# Patient Record
Sex: Female | Born: 1985 | Race: White | Hispanic: No | Marital: Married | Smoking: Never smoker
Health system: Southern US, Community
[De-identification: ages and names within clinical notes are randomized; demographics above are authoritative.]

## PROBLEM LIST (undated history)

## (undated) ENCOUNTER — Inpatient Hospital Stay: Payer: Self-pay

## (undated) DIAGNOSIS — Z8711 Personal history of peptic ulcer disease: Secondary | ICD-10-CM

## (undated) DIAGNOSIS — G43909 Migraine, unspecified, not intractable, without status migrainosus: Secondary | ICD-10-CM

## (undated) DIAGNOSIS — T753XXA Motion sickness, initial encounter: Secondary | ICD-10-CM

## (undated) DIAGNOSIS — Z87898 Personal history of other specified conditions: Secondary | ICD-10-CM

## (undated) DIAGNOSIS — Z8742 Personal history of other diseases of the female genital tract: Secondary | ICD-10-CM

## (undated) DIAGNOSIS — I498 Other specified cardiac arrhythmias: Secondary | ICD-10-CM

## (undated) DIAGNOSIS — J45909 Unspecified asthma, uncomplicated: Secondary | ICD-10-CM

## (undated) DIAGNOSIS — Z86711 Personal history of pulmonary embolism: Secondary | ICD-10-CM

## (undated) DIAGNOSIS — Z8719 Personal history of other diseases of the digestive system: Secondary | ICD-10-CM

## (undated) DIAGNOSIS — Z973 Presence of spectacles and contact lenses: Secondary | ICD-10-CM

## (undated) DIAGNOSIS — F329 Major depressive disorder, single episode, unspecified: Secondary | ICD-10-CM

## (undated) DIAGNOSIS — G90A Postural orthostatic tachycardia syndrome (POTS): Secondary | ICD-10-CM

## (undated) DIAGNOSIS — T4145XA Adverse effect of unspecified anesthetic, initial encounter: Secondary | ICD-10-CM

## (undated) DIAGNOSIS — Z87442 Personal history of urinary calculi: Secondary | ICD-10-CM

## (undated) DIAGNOSIS — R760 Raised antibody titer: Secondary | ICD-10-CM

## (undated) DIAGNOSIS — E669 Obesity, unspecified: Secondary | ICD-10-CM

## (undated) DIAGNOSIS — F32A Depression, unspecified: Secondary | ICD-10-CM

## (undated) DIAGNOSIS — R519 Headache, unspecified: Secondary | ICD-10-CM

## (undated) DIAGNOSIS — R51 Headache: Secondary | ICD-10-CM

## (undated) DIAGNOSIS — Z87440 Personal history of urinary (tract) infections: Secondary | ICD-10-CM

## (undated) DIAGNOSIS — J302 Other seasonal allergic rhinitis: Secondary | ICD-10-CM

## (undated) DIAGNOSIS — T8859XA Other complications of anesthesia, initial encounter: Secondary | ICD-10-CM

## (undated) HISTORY — DX: Obesity, unspecified: E66.9

## (undated) HISTORY — DX: Depression, unspecified: F32.A

## (undated) HISTORY — DX: Headache: R51

## (undated) HISTORY — DX: Raised antibody titer: R76.0

## (undated) HISTORY — PX: TUBAL LIGATION: SHX77

## (undated) HISTORY — DX: Headache, unspecified: R51.9

## (undated) HISTORY — DX: Personal history of other diseases of the female genital tract: Z87.42

## (undated) HISTORY — DX: Personal history of urinary (tract) infections: Z87.440

## (undated) HISTORY — DX: Other seasonal allergic rhinitis: J30.2

## (undated) HISTORY — DX: Major depressive disorder, single episode, unspecified: F32.9

## (undated) HISTORY — DX: Personal history of pulmonary embolism: Z86.711

## (undated) HISTORY — DX: Migraine, unspecified, not intractable, without status migrainosus: G43.909

## (undated) HISTORY — DX: Personal history of other specified conditions: Z87.898

---
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
## 2006-04-02 ENCOUNTER — Ambulatory Visit: Payer: Self-pay

## 2006-07-27 ENCOUNTER — Emergency Department: Payer: Self-pay | Admitting: Emergency Medicine

## 2006-07-27 ENCOUNTER — Other Ambulatory Visit: Payer: Self-pay

## 2006-07-29 ENCOUNTER — Ambulatory Visit: Payer: Self-pay | Admitting: Internal Medicine

## 2006-08-04 ENCOUNTER — Ambulatory Visit: Payer: Self-pay | Admitting: Neurology

## 2006-08-14 ENCOUNTER — Ambulatory Visit: Payer: Self-pay | Admitting: Neurology

## 2006-08-20 ENCOUNTER — Ambulatory Visit: Payer: Self-pay | Admitting: Internal Medicine

## 2006-08-26 ENCOUNTER — Ambulatory Visit: Payer: Self-pay | Admitting: Internal Medicine

## 2006-08-26 ENCOUNTER — Ambulatory Visit (HOSPITAL_COMMUNITY): Admission: RE | Admit: 2006-08-26 | Discharge: 2006-08-26 | Payer: Self-pay | Admitting: Internal Medicine

## 2007-02-15 ENCOUNTER — Other Ambulatory Visit: Payer: Self-pay

## 2007-02-15 ENCOUNTER — Emergency Department: Payer: Self-pay | Admitting: Emergency Medicine

## 2007-02-24 ENCOUNTER — Ambulatory Visit: Payer: Self-pay | Admitting: Internal Medicine

## 2007-05-12 ENCOUNTER — Emergency Department: Payer: Self-pay | Admitting: Emergency Medicine

## 2007-05-16 ENCOUNTER — Emergency Department: Payer: Self-pay | Admitting: Emergency Medicine

## 2007-06-01 ENCOUNTER — Ambulatory Visit: Payer: Self-pay | Admitting: Internal Medicine

## 2008-02-01 ENCOUNTER — Ambulatory Visit: Payer: Self-pay | Admitting: Internal Medicine

## 2008-02-01 ENCOUNTER — Inpatient Hospital Stay: Payer: Self-pay | Admitting: Internal Medicine

## 2008-02-28 ENCOUNTER — Emergency Department: Payer: Self-pay | Admitting: Emergency Medicine

## 2008-02-28 ENCOUNTER — Other Ambulatory Visit: Payer: Self-pay

## 2008-03-27 ENCOUNTER — Emergency Department: Payer: Self-pay | Admitting: Emergency Medicine

## 2008-03-27 ENCOUNTER — Other Ambulatory Visit: Payer: Self-pay

## 2008-04-04 ENCOUNTER — Ambulatory Visit: Payer: Self-pay | Admitting: Physician Assistant

## 2008-09-01 DIAGNOSIS — Z86711 Personal history of pulmonary embolism: Secondary | ICD-10-CM

## 2008-09-01 HISTORY — DX: Personal history of pulmonary embolism: Z86.711

## 2008-11-04 ENCOUNTER — Emergency Department: Payer: Self-pay | Admitting: Internal Medicine

## 2008-11-06 ENCOUNTER — Inpatient Hospital Stay: Payer: Self-pay | Admitting: Internal Medicine

## 2009-02-12 ENCOUNTER — Ambulatory Visit: Payer: Self-pay | Admitting: Neurology

## 2009-09-06 ENCOUNTER — Emergency Department: Payer: Self-pay | Admitting: Unknown Physician Specialty

## 2011-01-14 NOTE — Letter (Signed)
February 24, 2007    Bethann Punches, M.D.  [address]  [place], [place] [address]   RE:  Debra Terry, Debra Terry  MRN:  [id number]  /  DOB:  28-Sep-1985   Dear Loraine Leriche,   This letter will probably __________  voice mail to you, but thank you  very much for the opportunity to see Nimsi Males because of recurrent  and refractory symptoms of lightheadedness.   I had seen her, as you remember, for Dr. Grandville Silos in the fall because  of recurrent syncope.  Tilt table testing had been consistent with a  cardioinhibitory vasodepressor neurally mediated mechanism.  She was  started initially on Effexor for that.  About a month later, because  there was no significant improvement, she was started on Toprol.  She  then had a great four months.  At some point, however, her blood  pressure was noted to be elevated.  Her diastolic she thinks was over  191 on salt and water repletion, and her salt repletion was back down,  and in the wake of that she became increasingly symptomatic.  This is  characterized by both recurrent syncope as well as significant  unpredictable and irregular fatigue that can last for a day or so.   She has significant heat intolerance, significant shower intolerance.  I  forgot to ask her today about the status of her periods.   She is actually working right now and is able to stand most days at  work.  She sometimes needs to work on a stool, but has tolerated this  really quite well.   She has also recently developed chest pain.  This has been over the last  couple of weeks.  These episodes typically last about 5 minutes,  occurring mostly in the middle of the day.  They are associated with a  brackish taste.  They are not related to exertion.  She has noted no  change in the color or the odor of her stool.   Her diet is not particularly caffeine loaded.   Her medications currently include Effexor 75 and metoprolol 75, and  these have been up and down titrated, as you  know better than I.  She is  currently no longer on Keppra.  She has no known drug allergies.   SOCIAL HISTORY:  She is single.  As you know, she is out of high school  and she is working as a Associate Professor.   PHYSICAL EXAMINATION:  VITAL SIGNS:  Weight was 254 pounds, which is  stable.  Her blood pressure was 97/60, with a pulse of 76, sitting at 0  minutes it 96/60 with a pulse of 84, standing at 0 minutes it was 96/74  with a pulse of 86, and at 2 minutes it was 92/72 with a pulse of 86.  There was some accompanying dizziness.  HEENT:  Demonstrated no icterus or xanthomata.  NECK:  Veins were flat.  Carotids were briskly full bilaterally, without  bruits.  BACK:  Without kyphosis or scoliosis.  LUNGS:  Clear.  HEART SOUNDS:  Regular, without murmurs or gallops.  ABDOMEN:  Soft, with active bowel sounds.  EXTREMITIES:  Without edema.  There was no clubbing or cyanosis.  NEUROLOGIC:  Grossly normal.   IMPRESSION:  1. Neurally mediated syncope.  2. Apparent problems with hypertension.  3. Atypical chest pain, probably gastrointestinal in origin.  4. Obesity.   Loraine Leriche, I think that Ms. Wallace's story and her findings are  still most  consistent with neurally mediated episodes.  She and her mom have  educated themselves on the [employer] and the [facility] website and find  her symptoms quite consistent with the stories outlined there.   The fact that she got much better with her blood pressure improvement  also supports our working hypothesis, and the worsening of her symptoms  with a fall in blood pressure related to salt depletion is certainly  further support.   I think the issue is going to be trying to balance salt repletion with  blood pressure issues, and so I have encouraged her to go back on the  Gatorade.  If this is inadequate, low-dose Florinef may be beneficial in  the 60-100 mcg dose, needing to pay careful attention to her blood  pressure.   As relates to her  chest pain, I suspect that this is GI in origin,  accompanied by the brackish taste, and I did not start but will defer to  you whether you think a PPI or an over-the-counter H2 blocker might be  appropriate.   I look forward to talking with you about her to find out how I can best  be available to help in her long-term management.  If I can be of any  further assistance in the short term, please do not hesitate to let me  know.    Sincerely,      Duke Salvia, MD, [facility]  Electronically Signed    SCK/MedQ  DD: 02/24/2007  DT: 02/24/2007  Job #: [id number]

## 2011-01-17 NOTE — Letter (Signed)
August 20, 2006    Debra Terry, M.D.  [facility] Dept. of Neurology  [address]  [place], [place] [address]   RE:  Debra, Terry  MRN:  [id number]  /  DOB:  Feb 18, 1986   Dear Dr. Chestine Spore:   I hope that I have had a chance to meet you in person prior to your  receiving this letter.   It was a privilege to see Debra Terry today at your request because  of recurrent syncope.   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  These have included both syncopal spells as well as presyncopal  spells which in their immediate prodrome, albeit very brief in the  recovery phase, seem to the patient to be identical.  She has undergone  extensive neurological evaluation and has ended up on Keppra.  She  describes the spells as follows.  Most recently she had a spell  associated with full loss of consciousness.  She was standing at the  stove.  She became lightheaded but only for a moment before she  collapsed to the ground.  She was described by her friend as being pale.  There was no residual diaphoresis or nausea.  She was quite groggy.  She  tried to stand up afterwards.  She became quite nauseated and overall  took about an hour or two for her to recover.  This is relatively  typical for her standing spells associated with loss of consciousness.  There has been no loss of consciousness while seated.   Occasionally the prodrome has been a little bit longer, and occasionally  the recovery phase has been a little bit less associated with retrograde  amnesia.   She has had presyncopal spells.  These have occurred, for example, at  work while she was sitting she would become lightheaded and then become  somewhat vertiginous.  She would get very hot, then this would be  followed by mild diaphoresis.  She would be described as extremely pale.  There were some occasional  palpitations.  In the recovery phase there  was significant residual orthostatic intolerance and the symptoms would  last for about 15 or 20 minutes.  There was significant fatigue  afterwards as well.   She has a baseline history of shower intolerance as well as orthostatic  intolerance.  Her diet includes salt only with cooking but there is no  added salt.   Her menses are heavy but she notes no specific relation.  Her urine  color is light yellow.  She drinks only one caffeine cup a day.   Notably, she describes work as stressful.  This is important as the  frequency of these episodes accelerated over the summer and then  accelerated extremely over the fall.   Her past surgical history is notable for a tonsillectomy, eye surgery,  and D&C in 2007.   Her medical review of systems is notable for allergies and asthma.  Otherwise, it is negative.   Her medications include Keppra as noted at 500 b.i.d.   She has no known drug allergies.   On examination, she is a young Caucasian female appearing her stated age  of 25.  Her blood pressure is 121/85 with a pulse of 76 lying.  Sitting  it was 127/82 with a pulse of 85.  Standing at 0 minutes it was 135/82  with a pulse of 90.  At 5 minutes it was 136/92 with a pulse of 88.   Incomplete.    Sincerely,      Duke Salvia, MD, [facility]  Electronically Signed    SCK/MedQ  DD: 08/20/2006  DT: 08/20/2006  Job #: [id number]

## 2011-01-17 NOTE — Op Note (Signed)
NAMEWILLY, Debra Terry NO.:  [id number]   MEDICAL RECORD NO.:  [id number]          PATIENT TYPE:  OIB   LOCATION:  2854                         FACILITY:  [facility]   PHYSICIAN:  Duke Salvia, MD, FACCDATE OF BIRTH:  [birth year]   DATE OF PROCEDURE:  08/26/2006  DATE OF DISCHARGE:                               OPERATIVE REPORT   PREOPERATIVE DIAGNOSIS:  Syncope.   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   PROCEDURE:  Head up, tilt table testing.   DESCRIPTION OF PROCEDURE:  Following obtaining the informed consent, the  patient was brought to the electrophysiology laboratory and placed on  the fluoroscopic table.  After equilibration in the supine position with  blood pressures in the 110-115 range and pulses in the high 70s low 80s;  the patient was tilted up to 70 degrees.  There was some lability in  heart rate with a rapid change in heart rate over 2 minutes to 102,  gradual equilibration and then a gradual progressive heart rate after 1  minute 15 seconds to a peak of 120. Throughout this duration the blood  pressures remained relatively stable as well.   At 1 minute 15 the pulse then change rapidly from 101 down to 71,  reaching a nadir of 68.  The blood pressure changed abruptly from 110/38  to a pulse was 60/44 with, then, some stabilization in the high 70s low  80s with a subsequent fall into the 60s; again, the pulse continued to  fall from mid-70s.  It peeked up, again, to 102 and then fell to 68 at  which time the test was terminated.  The patient was in tears, wanting  to stop; and it was elected to stop at that point.   IMPRESSION:  Abnormal tilt table test manifested by vaso depression and  cardioinhibition with epi phenomena consistent with her previous  episodes.   RECOMMENDATIONS:  Would treat with a presumptive diagnosis of nearly  mediated syncope with  salt-and-water replacement, isometric exercises,  and consideration for SSRI therapy, and/or beta blocker therapy, and/or  Florinef as symptoms dictate.      Duke Salvia, MD, [facility]  Electronically Signed     SCK/MEDQ  D:  08/26/2006  T:  08/26/2006  Job:  [id number]   cc:   Suzan Slick, M.D.  Electrophysiology Laboratory

## 2011-01-22 ENCOUNTER — Ambulatory Visit (INDEPENDENT_AMBULATORY_CARE_PROVIDER_SITE_OTHER): Payer: BC Managed Care – PPO | Admitting: Family Medicine

## 2011-01-22 ENCOUNTER — Encounter: Payer: Self-pay | Admitting: Family Medicine

## 2011-01-22 DIAGNOSIS — M2550 Pain in unspecified joint: Secondary | ICD-10-CM

## 2011-01-22 DIAGNOSIS — R894 Abnormal immunological findings in specimens from other organs, systems and tissues: Secondary | ICD-10-CM

## 2011-01-22 DIAGNOSIS — Z86711 Personal history of pulmonary embolism: Secondary | ICD-10-CM

## 2011-01-22 DIAGNOSIS — F329 Major depressive disorder, single episode, unspecified: Secondary | ICD-10-CM

## 2011-01-22 DIAGNOSIS — R760 Raised antibody titer: Secondary | ICD-10-CM | POA: Insufficient documentation

## 2011-01-22 DIAGNOSIS — F3289 Other specified depressive episodes: Secondary | ICD-10-CM

## 2011-01-22 DIAGNOSIS — G43909 Migraine, unspecified, not intractable, without status migrainosus: Secondary | ICD-10-CM

## 2011-01-22 MED ORDER — MELOXICAM 15 MG PO TABS: ORAL_TABLET | ORAL | Status: DC

## 2011-01-22 NOTE — Patient Instructions (Signed)
Return in next 1-2 months for follow up and physical Return fasting prior to physical for blood work. I will request records from previous PCP. Course of mobic once daily with food for 7 days to see if we can help with joint pains. Call us with questions.

## 2011-01-22 NOTE — Progress Notes (Signed)
Subjective:    Patient ID: Debra Terry, female    DOB: 09/25/1985, 25 y.o.   MRN: [id number]  HPI CC: new patient, establish  Previously saw Dr. Gavin Potters (x1) and before that Dr. Hyacinth Meeker at [facility].  Joint pain - started lower back about 2 years ago, now in knees, elbows.  Stiffness, pain.  Stiff in am, throughout day pain comes.  No redness/swelling in joints.  On her feet 11 hours a day.  No pain in feet or hands, shoulders, hip.  Takes alleve once daily which doesn't really help anymore.  No oral lesions, no new rashes.  No chest pain.  Muscle pain - mild to severe pain mainly back of calves as well as arm pain medial forearm.  Depression - started on wellbutrin last week, also on sertraline 100mg  daily.  No SI/HI.  Working on getting in to see counselor for coping with recent TAB late last year for pregnancy - daughter with turner's, heart condition.  Shaelyn says she regrets her decision, especially had trouble during mother's day.  Separated from husband after this stress, currently with boyfriend.  H/o neurocardiogenic syncope per patient, currently on metoprolol to help. 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  States able to tolerate NSAIDs. Was on lovenox during pregnancy last year.  Preventative: OBGYN - Dr. Patsy Lager, sees for well woman, last pap 12/2010 and normal. Tetanus 09/2010 - at Dr. Gavin Potters' Been over a year since last CPE, 1 1/2 years since saw hematologist.  Review of Systems  Constitutional: Positive for unexpected weight change (previously lost 100lbs, gained 20 lbs back.). Negative for fever, chills, activity change, appetite change and fatigue.  HENT: Negative for hearing loss and neck pain.   Eyes: Positive for photophobia. Negative for visual disturbance.  Respiratory: Negative for cough, choking, chest tightness, shortness of breath and wheezing.   Cardiovascular: Negative for chest pain,  palpitations and leg swelling.  Gastrointestinal: Positive for nausea. Negative for vomiting, abdominal pain, diarrhea, constipation, blood in stool and abdominal distention.  Genitourinary: Negative for hematuria and difficulty urinating.  Musculoskeletal: Positive for arthralgias. Negative for myalgias and joint swelling.  Skin: Negative for rash.  Neurological: Positive for headaches. Negative for dizziness, seizures and syncope.  Hematological: Bruises/bleeds easily.  Psychiatric/Behavioral: Positive for dysphoric mood. The patient is nervous/anxious.        Objective:   Physical Exam  Nursing note and vitals reviewed. Constitutional: She is oriented to person, place, and time. She appears well-developed and well-nourished. No distress.  HENT:  Head: Normocephalic and atraumatic.  Right Ear: External ear normal.  Left Ear: External ear normal.  Nose: Nose normal.  Mouth/Throat: Oropharynx is clear and moist.  Eyes: Conjunctivae and EOM are normal. Pupils are equal, round, and reactive to light. No scleral icterus.  Neck: Normal range of motion. Neck supple. No thyromegaly present.  Cardiovascular: Normal rate, regular rhythm, normal heart sounds and intact distal pulses.   No murmur heard. Pulses:      Radial pulses are 2+ on the right side, and 2+ on the left side.  Pulmonary/Chest: Effort normal and breath sounds normal. No respiratory distress. She has no wheezes. She has no rales.  Abdominal: Soft. Bowel sounds are normal. She exhibits no distension and no mass. There is no tenderness. There is no rebound and no guarding.  Musculoskeletal: Normal range of motion. She exhibits no edema and no tenderness.  FROM at elbows, knees No pain with palpation, no evident erythema or swelling of joints noted. No pain lateral/medial epicondyles, olecranon No pain anterior knee, patella, joint lines, no crepitus, no deformity. Bilaterally.  Lymphadenopathy:    She has no cervical  adenopathy.  Neurological: She is alert and oriented to person, place, and time.       CN grossly intact, station and gait intact  Skin: Skin is warm and dry. No rash noted.  Psychiatric: She has a normal mood and affect. Her behavior is normal. Judgment and thought content normal.       Assessment & Plan:

## 2011-01-23 DIAGNOSIS — M2550 Pain in unspecified joint: Secondary | ICD-10-CM | POA: Insufficient documentation

## 2011-01-23 NOTE — Assessment & Plan Note (Signed)
Elbows and knees.  No hand joint pains.  No inflammatory arthritis sxs. Unclear etiology.  No other sxs to suggest systemic condition. ? Worsening arthralgias 2/2 worsening depression/stress. Treat with course of NSAIDs for 1 wk (pt states has tolerated NSAIDs ok in past). If not better consider xrays and/or blood work.

## 2011-01-23 NOTE — Assessment & Plan Note (Signed)
Spent considerable amount of time discussing worsening mood issues, anticipate adjustment disorder surrounding coming to terms with recent TAB leading to depression. Discussed klonopin may not be best option if truly depression.  States using only prn basis. For now no med changes, recently started by OBGYN on wellbutrin, states wants adjunctive med prior to increasing sertraline.

## 2011-01-23 NOTE — Assessment & Plan Note (Addendum)
Stable.       - Continue to monitor

## 2011-01-23 NOTE — Assessment & Plan Note (Signed)
States previously well controlled, currently worsening again.  Has never been on ppx medication.  Using fioricet abortive.

## 2011-01-28 ENCOUNTER — Emergency Department: Payer: Self-pay | Admitting: Emergency Medicine

## 2011-02-06 ENCOUNTER — Encounter: Payer: Self-pay | Admitting: Family Medicine

## 2011-02-06 ENCOUNTER — Ambulatory Visit (INDEPENDENT_AMBULATORY_CARE_PROVIDER_SITE_OTHER): Payer: BC Managed Care – PPO | Admitting: Family Medicine

## 2011-02-06 VITALS — BP 120/84 | HR 66 | Temp 98.6°F | Wt 230.2 lb

## 2011-02-06 DIAGNOSIS — N390 Urinary tract infection, site not specified: Secondary | ICD-10-CM

## 2011-02-06 DIAGNOSIS — R1110 Vomiting, unspecified: Secondary | ICD-10-CM | POA: Insufficient documentation

## 2011-02-06 LAB — POCT URINALYSIS DIPSTICK
Blood, UA: NEGATIVE
Ketones, UA: NEGATIVE
Protein, UA: NEGATIVE
Spec Grav, UA: 1.025
Urobilinogen, UA: 0.2

## 2011-02-06 MED ORDER — PROMETHAZINE HCL 25 MG PO TABS: 25.0000 mg | ORAL_TABLET | Freq: Three times a day (TID) | ORAL | Status: DC | PRN

## 2011-02-06 NOTE — Progress Notes (Signed)
Went to gyn clinic, dx'd with UTI.  Had vomited some blood, so was sent to ER.  Likely that she had been vomiting from pain with the UTI and this likely caused irritation in GI tract.  Was on cipro for the uti, today was the last day of the abx.  She took prilosec and phenergan for NAV.  Still with some nausea,but controlled with meds.  Not vomiting.  No fevers.  H/o kidney infection in childhood.  Now with continued R back pain.  No dysuria now.  No discharge, no abnormal bleeding.  No abd pain.  No nosebleeds, bruising, hemoptysis.  Was on ASA for lupus anticoagulant.  Intolerant of coumadin.  Meds, vitals, and allergies reviewed.   ROS: See HPI.  Otherwise, noncontributory.  ncat Tm wnl, nasal and oral exam wnl Neck supple w/o LA rrr ctab abd soft, not ttp in suprapubic area Back w/o midline pain R lower lumbar area tender, reproduced with stretching.  No cva pain

## 2011-02-06 NOTE — Patient Instructions (Signed)
I would keep taking the phenergan as needed.  Keep taking the prilosec for another 2 weeks.  I would stretch the muscles in your back.  Let us know if you have a fever or other concerns.  Take care.

## 2011-02-07 ENCOUNTER — Encounter: Payer: Self-pay | Admitting: Family Medicine

## 2011-02-07 ENCOUNTER — Telehealth: Payer: Self-pay | Admitting: *Deleted

## 2011-02-07 ENCOUNTER — Ambulatory Visit (INDEPENDENT_AMBULATORY_CARE_PROVIDER_SITE_OTHER): Payer: BC Managed Care – PPO | Admitting: Family Medicine

## 2011-02-07 DIAGNOSIS — R509 Fever, unspecified: Secondary | ICD-10-CM

## 2011-02-07 DIAGNOSIS — M549 Dorsalgia, unspecified: Secondary | ICD-10-CM | POA: Insufficient documentation

## 2011-02-07 DIAGNOSIS — R109 Unspecified abdominal pain: Secondary | ICD-10-CM

## 2011-02-07 LAB — POCT URINALYSIS DIPSTICK
Blood, UA: NEGATIVE
Glucose, UA: NEGATIVE
Nitrite, UA: NEGATIVE
Urobilinogen, UA: 0.2

## 2011-02-07 MED ORDER — CIPROFLOXACIN HCL 500 MG PO TABS: 500.0000 mg | ORAL_TABLET | Freq: Two times a day (BID) | ORAL | Status: AC

## 2011-02-07 NOTE — Telephone Encounter (Signed)
Spoke with patient. She will be here at 4:15.

## 2011-02-07 NOTE — Assessment & Plan Note (Signed)
She was treated with cipro. Her U/a today is unremarkable and I doubt that she has any ongoing infection.  Likely with residual pain in back due to muscle strain from vomiting.  I don't suspect pyelo.  She is well appearing.  I would use phenergan prn, stretch for her back pain, and fu prn.  She agrees.

## 2011-02-07 NOTE — Telephone Encounter (Signed)
Add her on at the end of the day-4:15.

## 2011-02-07 NOTE — Patient Instructions (Signed)
Start the cipro today and use the phenergan as needed.  If you have a significant increase in pain or a decrease in urine output, then you need to go to the ER.  Take care.  Call me with an update next week.

## 2011-02-07 NOTE — Assessment & Plan Note (Addendum)
Now with fever, urgency and more back pain.  Treat for resumed pyelo.  Check UCx, start higher dose of cipro, out of work in meantime and if progressive sx then proceed to ER.  She agrees.  Okay for outpatient fu.  Call back with update.  Her abd exam is benign o/w.  No indication of lower pelvic disease, ie cervicitis, PID etc based on history.

## 2011-02-07 NOTE — Telephone Encounter (Signed)
Pt was seen yesterday for ER follow up for UTI, vomiting blood.  She was told to call if she had fever.  She states her temp was 103 this morning, not feeling well now.  Please advise.

## 2011-02-07 NOTE — Progress Notes (Signed)
Recheck for back pain and fever.  No fever yesterday.  Fever to 103 early this AM.  Not coughing, no bloody sputum.  Still on phenergan.  Fever came down at about 3:30.  No pain with urination.  Now with urinary urgency, this is new.  Cipro 250mg  bid finished yesterday.  Back is still hurting.  Urine reviewed today.  Fatigued, didn't sleep well.  No discharge.  LMP was 2 weeks ago.  Fever and amount/location of back pain are new.  Meds, vitals, and allergies reviewed.   ROS: See HPI.  Otherwise, noncontributory.  nad ncat Mmm rrr ctab Back w/o midline pain Still with unilateral lower lumbar paraspinal muscle tenderness, but it extends higher now abd soft, not ttp, no rebound, not ttp in suprapubic area Ext well perfused

## 2011-02-09 ENCOUNTER — Encounter: Payer: Self-pay | Admitting: Family Medicine

## 2011-02-09 LAB — URINE CULTURE

## 2011-02-25 ENCOUNTER — Other Ambulatory Visit: Payer: Self-pay | Admitting: *Deleted

## 2011-02-25 NOTE — Telephone Encounter (Signed)
i don't think I ever got PA in my box.  Can you send me another?

## 2011-02-25 NOTE — Telephone Encounter (Signed)
Refill request from [facility] [address] for lansoprazole is in your in box.  This is not on med list, omeprazole is on med list.  This was previously prescribed by a doctor in [place].

## 2011-02-28 ENCOUNTER — Other Ambulatory Visit: Payer: Self-pay | Admitting: *Deleted

## 2011-02-28 MED ORDER — MELOXICAM 15 MG PO TABS: ORAL_TABLET | ORAL | Status: DC

## 2011-02-28 NOTE — Telephone Encounter (Signed)
I dont know what happened to the form, but refill for meloxicam needs to be approve

## 2011-02-28 NOTE — Telephone Encounter (Signed)
Opened in error. Med refill request already sent to Dr. Reece Agar.

## 2011-02-28 NOTE — Telephone Encounter (Signed)
Refilled meloxicam.  Please route me PA for lasoprazole.

## 2011-03-01 ENCOUNTER — Other Ambulatory Visit: Payer: Self-pay | Admitting: *Deleted

## 2011-03-01 MED ORDER — LANSOPRAZOLE 30 MG PO CPDR: 30.0000 mg | DELAYED_RELEASE_CAPSULE | Freq: Every day | ORAL | Status: AC

## 2011-03-01 NOTE — Telephone Encounter (Signed)
PA isnt needed for lansoprazole, refill request came in.  I ok'd for 6 refills, please advice Morrie Sheldon if not ok so that she can call pharmacy and correct.

## 2011-03-01 NOTE — Telephone Encounter (Signed)
Unsure what this note is for.

## 2011-03-01 NOTE — Telephone Encounter (Signed)
Noted. Thanks.

## 2011-04-10 ENCOUNTER — Telehealth: Payer: Self-pay | Admitting: *Deleted

## 2011-04-10 NOTE — Telephone Encounter (Signed)
Pt states she thinks she has a UTI- has burning, hematuria, fever of 101-103.  She is asking that something be called to [facility].  I advised her that she would need to be seen in order to get an antibiotic, but she says she is unable to leave work, doesn't have a day off until Sunday.

## 2011-04-10 NOTE — Telephone Encounter (Signed)
If fever so high, concern for kidney infection.  Definitely needs to be seen.  If not here then at [facility] after work.

## 2011-04-10 NOTE — Telephone Encounter (Signed)
Attempted to call patient. Had to leave message advising her that she needs to be evaluated for possible kidney infection. I told her that Dr. Reece Agar had a 4:15 slot today if she could come to that or she would need to go to an [facility] or ER after work. I explained that she definitely needed to be evaluated one way or the other and that abx couldn't be called in without the eval. I instructed her to call back and advise if she wants the 4:15 appt today.

## 2011-04-11 NOTE — Telephone Encounter (Signed)
Can we call for update at mobile or at work?

## 2011-04-11 NOTE — Telephone Encounter (Signed)
Message left for patient to call me back and advise on how she is feeling and if she was seen for eval.

## 2011-04-14 NOTE — Telephone Encounter (Signed)
Message left for patient to return my call and advise how she is feeling

## 2011-04-14 NOTE — Telephone Encounter (Signed)
Patient called and left a message that she is feeling better. No more fever and no more pain. She has been taking OTC AZO and leftover Cipro twice daily. She just hasn't been able to get off work to come in. She said she appreciated [place] checking on her, but she thinks she's doing better and will call back if worsening instead of improving.

## 2011-04-30 ENCOUNTER — Other Ambulatory Visit: Payer: Self-pay | Admitting: Family Medicine

## 2011-04-30 ENCOUNTER — Other Ambulatory Visit (INDEPENDENT_AMBULATORY_CARE_PROVIDER_SITE_OTHER): Payer: BC Managed Care – PPO

## 2011-04-30 DIAGNOSIS — Z0000 Encounter for general adult medical examination without abnormal findings: Secondary | ICD-10-CM

## 2011-04-30 LAB — COMPREHENSIVE METABOLIC PANEL WITH GFR
ALT: 14 U/L (ref 0–35)
AST: 17 U/L (ref 0–37)
Albumin: 4.4 g/dL (ref 3.5–5.2)
Alkaline Phosphatase: 72 U/L (ref 39–117)
BUN: 20 mg/dL (ref 6–23)
CO2: 26 meq/L (ref 19–32)
Calcium: 9 mg/dL (ref 8.4–10.5)
Chloride: 106 meq/L (ref 96–112)
Creatinine, Ser: 0.8 mg/dL (ref 0.4–1.2)
GFR: 99.42 mL/min
Glucose, Bld: 88 mg/dL (ref 70–99)
Potassium: 3.8 meq/L (ref 3.5–5.1)
Sodium: 140 meq/L (ref 135–145)
Total Bilirubin: 0.9 mg/dL (ref 0.3–1.2)
Total Protein: 7.1 g/dL (ref 6.0–8.3)

## 2011-04-30 LAB — LIPID PANEL
Cholesterol: 159 mg/dL (ref 0–200)
HDL: 37.8 mg/dL — ABNORMAL LOW
LDL Cholesterol: 86 mg/dL (ref 0–99)
Total CHOL/HDL Ratio: 4
Triglycerides: 175 mg/dL — ABNORMAL HIGH (ref 0.0–149.0)
VLDL: 35 mg/dL (ref 0.0–40.0)

## 2011-04-30 LAB — CBC WITH DIFFERENTIAL/PLATELET
Basophils Relative: 0.2 % (ref 0.0–3.0)
Eosinophils Absolute: 0.2 10*3/uL (ref 0.0–0.7)
Hemoglobin: 14.9 g/dL (ref 12.0–15.0)
Lymphocytes Relative: 36.3 % (ref 12.0–46.0)
MCHC: 33.7 g/dL (ref 30.0–36.0)
MCV: 94 fl (ref 78.0–100.0)
Neutro Abs: 3.8 10*3/uL (ref 1.4–7.7)
RBC: 4.7 Mil/uL (ref 3.87–5.11)

## 2011-04-30 LAB — TSH: TSH: 0.85 u[IU]/mL (ref 0.35–5.50)

## 2011-05-01 ENCOUNTER — Encounter: Payer: Self-pay | Admitting: Family Medicine

## 2011-05-01 ENCOUNTER — Ambulatory Visit (INDEPENDENT_AMBULATORY_CARE_PROVIDER_SITE_OTHER): Payer: BC Managed Care – PPO | Admitting: Family Medicine

## 2011-05-01 DIAGNOSIS — R894 Abnormal immunological findings in specimens from other organs, systems and tissues: Secondary | ICD-10-CM

## 2011-05-01 DIAGNOSIS — M2550 Pain in unspecified joint: Secondary | ICD-10-CM

## 2011-05-01 DIAGNOSIS — Z0000 Encounter for general adult medical examination without abnormal findings: Secondary | ICD-10-CM | POA: Insufficient documentation

## 2011-05-01 DIAGNOSIS — J309 Allergic rhinitis, unspecified: Secondary | ICD-10-CM | POA: Insufficient documentation

## 2011-05-01 DIAGNOSIS — M79609 Pain in unspecified limb: Secondary | ICD-10-CM

## 2011-05-01 DIAGNOSIS — F329 Major depressive disorder, single episode, unspecified: Secondary | ICD-10-CM

## 2011-05-01 DIAGNOSIS — R760 Raised antibody titer: Secondary | ICD-10-CM

## 2011-05-01 DIAGNOSIS — M79643 Pain in unspecified hand: Secondary | ICD-10-CM | POA: Insufficient documentation

## 2011-05-01 MED ORDER — CETIRIZINE HCL 10 MG PO CAPS: 10.0000 mg | ORAL_CAPSULE | Freq: Every day | ORAL | Status: DC

## 2011-05-01 MED ORDER — FLUTICASONE PROPIONATE 50 MCG/ACT NA SUSP: 2.0000 | Freq: Every day | NASAL | Status: DC

## 2011-05-01 MED ORDER — MELOXICAM 15 MG PO TABS: 15.0000 mg | ORAL_TABLET | Freq: Every day | ORAL | Status: DC | PRN

## 2011-05-01 NOTE — Assessment & Plan Note (Signed)
Stable on current meds 

## 2011-05-01 NOTE — Assessment & Plan Note (Signed)
Controlled on mobic, however discussed decreased effectiveness of ASA when using regular NSAIDs.  Advised back off NSAID and only use PRN.

## 2011-05-01 NOTE — Assessment & Plan Note (Signed)
Change antihistamine, start flonase.

## 2011-05-01 NOTE — Assessment & Plan Note (Addendum)
Reviewed preventative protocols and updated unless pt declines. Deferred breast/pap as pt receives these with well woman at Dr. Cyndie Chime, OBGYN.  Had done this year, states normal. Discussed importance of being seen if fever/pyelo sxs.  Pt endorses understanding.

## 2011-05-01 NOTE — Assessment & Plan Note (Signed)
Unclear etiology. Bilateral hand pain described along thenar eminence and web between index and thumb bilaterally. Not consistent with CTS or raynaud's, not at any specific joints, fact that it comes and goes points against infection. Exam today normal. Describes pain worse after day at work (repetitive hand motions). Advised return when having pain/redness.  Job change will likely help as well.

## 2011-05-01 NOTE — Patient Instructions (Signed)
Try to restart claritin or zyrtec.  Also nasal saline.  I have sent in flonase script to pharmacy.  Takes a few weeks to take effect. Try to back off mobic daily - use only as needed as this will decrease effectiveness of aspirin. Return in 6 months or as needed. If you have another fever with urinary symptoms, you need to be seen.

## 2011-05-01 NOTE — Progress Notes (Signed)
Subjective:    Patient ID: Debra Terry, female    DOB: 10/13/1985, 25 y.o.   MRN: [id number]  HPI CC: CPE today  Hands getting extremely red and tender, get stiff.  Mobic helping with joint pains, not hands.  Happening every day, worse at work.  Works as Associate Professor.  Pain associated with redness and warmth of joints.  Pain described as sharp and throbbing, constant.  Pain along thenar eminence between thumb and index finger.  Tried ice - did help some.  Heat doesn't help.  Currently not tender, red.  Allergies - constant sxs.  Allergic to ragweed.  Has tried zyrtec, claritin, allegra.  Does use saline sprays, doesn't really help.  More congestion, watery eyes, itchy eyes, and some RN.  + PNDrip.  Wt Readings from Last 3 Encounters:  05/01/11 249 lb 4 oz (113.059 kg)  02/06/11 230 lb 4 oz (104.441 kg)  01/22/11 227 lb 0.6 oz (102.985 kg)  Tries to eat healthy, more water, cut out sodas.  States walking 2 mi on elliptical daily.  Depo started 3 mo ago.  Feels mood stable on wellbutrin, zoloft.  Preventative:  OBGYN - Dr. Patsy Lager, sees for well woman, last pap 12/2010 and normal. Tetanus 09/2010 - at Dr. Gavin Potters' Reviewed blood work in detail.  Review of Systems  Constitutional: Positive for unexpected weight change (weight gain). Negative for fever, chills, activity change, appetite change and fatigue.  HENT: Negative for hearing loss and neck pain.   Eyes: Negative for visual disturbance.  Respiratory: Negative for cough, chest tightness, shortness of breath and wheezing.   Cardiovascular: Negative for chest pain, palpitations and leg swelling.  Gastrointestinal: Positive for nausea. Negative for vomiting, abdominal pain, diarrhea, constipation, blood in stool and abdominal distention.  Genitourinary: Negative for hematuria and difficulty urinating.  Musculoskeletal: Negative for myalgias and arthralgias.  Skin: Negative for rash.  Neurological: Positive for headaches. Negative for  dizziness, seizures and syncope.  Hematological: Does not bruise/bleed easily.  Psychiatric/Behavioral: Negative for dysphoric mood. The patient is not nervous/anxious.        Objective:   Physical Exam  Nursing note and vitals reviewed. Constitutional: She is oriented to person, place, and time. She appears well-developed and well-nourished. No distress.  HENT:  Head: Normocephalic and atraumatic.  Right Ear: External ear normal.  Left Ear: External ear normal.  Nose: Nose normal.  Mouth/Throat: Oropharynx is clear and moist. No oropharyngeal exudate.  Eyes: Conjunctivae and EOM are normal. Pupils are equal, round, and reactive to light. No scleral icterus.  Neck: Normal range of motion. Neck supple. No thyromegaly present.  Cardiovascular: Normal rate, regular rhythm, normal heart sounds and intact distal pulses.   No murmur heard. Pulses:      Radial pulses are 2+ on the right side, and 2+ on the left side.  Pulmonary/Chest: Effort normal and breath sounds normal. No respiratory distress. She has no wheezes. She has no rales.       Breast: deferred  Abdominal: Soft. Bowel sounds are normal. She exhibits no distension and no mass. There is no tenderness. There is no rebound and no guarding.  Genitourinary:       deferred  Musculoskeletal: Normal range of motion.       Right hand: Normal.       Left hand: Normal.       No erythema, warmth, or tenderness to palpation.  Lymphadenopathy:    She has no cervical adenopathy.  Neurological: She is alert and oriented  to person, place, and time.       CN grossly intact, station and gait intact  Skin: Skin is warm and dry. No rash noted.  Psychiatric: She has a normal mood and affect. Her behavior is normal. Judgment and thought content normal.          Assessment & Plan:

## 2011-05-01 NOTE — Assessment & Plan Note (Signed)
On ASA.   Coumadin caused GI bleed. Discussed minimize use of NSAIDs.

## 2011-05-02 ENCOUNTER — Other Ambulatory Visit: Payer: Self-pay | Admitting: *Deleted

## 2011-05-02 MED ORDER — BUTALBITAL-APAP-CAFFEINE 50-325-40 MG PO TABS: 1.0000 | ORAL_TABLET | Freq: Two times a day (BID) | ORAL | Status: DC | PRN

## 2011-05-02 NOTE — Telephone Encounter (Signed)
Sent in

## 2011-05-02 NOTE — Telephone Encounter (Signed)
Ok to refill 

## 2011-06-19 ENCOUNTER — Other Ambulatory Visit: Payer: Self-pay | Admitting: *Deleted

## 2011-06-19 MED ORDER — METOPROLOL TARTRATE 25 MG PO TABS: 25.0000 mg | ORAL_TABLET | Freq: Two times a day (BID) | ORAL | Status: DC

## 2011-08-21 ENCOUNTER — Ambulatory Visit: Payer: BC Managed Care – PPO | Admitting: Family Medicine

## 2011-10-01 ENCOUNTER — Emergency Department: Payer: Self-pay | Admitting: Emergency Medicine

## 2011-10-01 LAB — COMPREHENSIVE METABOLIC PANEL
BUN: 13 mg/dL (ref 7–18)
Bilirubin,Total: 1 mg/dL (ref 0.2–1.0)
Chloride: 102 mmol/L (ref 98–107)
Creatinine: 0.68 mg/dL (ref 0.60–1.30)
EGFR (African American): >60
Osmolality: 278 (ref 275–301)
Potassium: 3.9 mmol/L (ref 3.5–5.1)
SGPT (ALT): 33 U/L
Sodium: 139 mmol/L (ref 136–145)
Total Protein: 8.4 g/dL — ABNORMAL HIGH (ref 6.4–8.2)

## 2011-10-01 LAB — URINALYSIS, COMPLETE
Bilirubin,UR: NEGATIVE
Ketone: NEGATIVE
Protein: NEGATIVE
RBC,UR: <1 /HPF (ref 0–5)
Squamous Epithelial: 8
WBC UR: 1 /HPF (ref 0–5)

## 2011-10-01 LAB — PREGNANCY, URINE: Pregnancy Test, Urine: NEGATIVE m[IU]/mL

## 2011-10-01 LAB — CBC
HGB: 16.9 g/dL — ABNORMAL HIGH (ref 12.0–16.0)
RBC: 5.38 10*6/uL — ABNORMAL HIGH (ref 3.80–5.20)
WBC: 7 10*3/uL (ref 3.6–11.0)

## 2011-10-01 LAB — LIPASE, BLOOD: Lipase: 211 U/L (ref 73–393)

## 2011-10-02 LAB — URINE CULTURE

## 2012-08-31 ENCOUNTER — Other Ambulatory Visit: Payer: Self-pay | Admitting: *Deleted

## 2014-04-11 ENCOUNTER — Ambulatory Visit: Payer: Self-pay | Admitting: Internal Medicine

## 2014-04-20 DIAGNOSIS — H538 Other visual disturbances: Secondary | ICD-10-CM | POA: Insufficient documentation

## 2014-04-20 DIAGNOSIS — R51 Headache: Secondary | ICD-10-CM

## 2014-04-20 DIAGNOSIS — R040 Epistaxis: Secondary | ICD-10-CM | POA: Insufficient documentation

## 2014-04-20 DIAGNOSIS — R519 Headache, unspecified: Secondary | ICD-10-CM | POA: Insufficient documentation

## 2014-04-28 ENCOUNTER — Ambulatory Visit: Payer: Self-pay | Admitting: Neurology

## 2014-04-28 LAB — CBC WITH DIFFERENTIAL/PLATELET
Basophil #: 0.1 10*3/uL (ref 0.0–0.1)
Basophil %: 0.7 %
Eosinophil #: 0.3 10*3/uL (ref 0.0–0.7)
Eosinophil %: 3.4 %
HCT: 45.2 % (ref 35.0–47.0)
HGB: 15.2 g/dL (ref 12.0–16.0)
Lymphocyte #: 3 10*3/uL (ref 1.0–3.6)
Lymphocyte %: 36 %
MCH: 31.3 pg (ref 26.0–34.0)
MCHC: 33.6 g/dL (ref 32.0–36.0)
MCV: 93 fL (ref 80–100)
Monocyte #: 0.3 x10 3/mm (ref 0.2–0.9)
Monocyte %: 4 %
NEUTROS ABS: 4.6 10*3/uL (ref 1.4–6.5)
Neutrophil %: 55.9 %
Platelet: 189 10*3/uL (ref 150–440)
RBC: 4.87 10*6/uL (ref 3.80–5.20)
RDW: 12.1 % (ref 11.5–14.5)
WBC: 8.3 10*3/uL (ref 3.6–11.0)

## 2014-04-28 LAB — HCG, QUANTITATIVE, PREGNANCY: Beta Hcg, Quant.: <1 m[IU]/mL — ABNORMAL LOW

## 2014-04-28 LAB — PROTIME-INR
INR: 0.9
Prothrombin Time: 12.5 secs (ref 11.5–14.7)

## 2015-02-22 ENCOUNTER — Encounter: Payer: Self-pay | Admitting: Emergency Medicine

## 2015-02-22 ENCOUNTER — Ambulatory Visit
Admission: EM | Admit: 2015-02-22 | Discharge: 2015-02-22 | Disposition: A | Discharge status: Home / Self Care | Payer: BLUE CROSS/BLUE SHIELD | Attending: Internal Medicine | Admitting: Internal Medicine

## 2015-02-22 DIAGNOSIS — M62838 Other muscle spasm: Secondary | ICD-10-CM

## 2015-02-22 DIAGNOSIS — M79641 Pain in right hand: Secondary | ICD-10-CM

## 2015-02-22 DIAGNOSIS — M2550 Pain in unspecified joint: Secondary | ICD-10-CM | POA: Diagnosis not present

## 2015-02-22 DIAGNOSIS — R51 Headache: Secondary | ICD-10-CM | POA: Diagnosis not present

## 2015-02-22 DIAGNOSIS — M545 Low back pain: Secondary | ICD-10-CM | POA: Diagnosis not present

## 2015-02-22 DIAGNOSIS — R519 Headache, unspecified: Secondary | ICD-10-CM

## 2015-02-22 MED ORDER — PREDNISONE 50 MG PO TABS
ORAL_TABLET | ORAL | Status: DC
Start: 2015-02-22 — End: 2015-10-26

## 2015-02-22 MED ORDER — NAPROXEN 250 MG PO TABS
250.0000 mg | ORAL_TABLET | Freq: Two times a day (BID) | ORAL | Status: DC
Start: 2015-02-22 — End: 2016-08-28

## 2015-02-22 MED ORDER — KETOROLAC TROMETHAMINE 60 MG/2ML IM SOLN
60.0000 mg | Freq: Once | INTRAMUSCULAR | Status: AC
  Administered 2015-02-22: 60 mg via INTRAMUSCULAR

## 2015-02-22 MED ORDER — METAXALONE 800 MG PO TABS: 800.0000 mg | ORAL_TABLET | Freq: Three times a day (TID) | ORAL | Status: DC

## 2015-02-22 NOTE — ED Provider Notes (Signed)
CSN: [id number]     Arrival date & time 02/22/15  4098 History   First MD Initiated Contact with Patient 02/22/15 [phone]     Chief Complaint  Patient presents with  . Hand Pain  . 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 female presenting with hand pain. The history is provided by the patient.  Hand Pain This is a new problem. The current episode started 12 to 24 hours ago. The problem occurs daily. The problem has been resolved. Associated symptoms include chest pain and headaches. Pertinent negatives include no abdominal pain and no shortness of breath. The symptoms are aggravated by bending, twisting and exertion. The symptoms are relieved by sleep. She has tried rest, food and water for the symptoms. The treatment provided significant relief.    Past Medical History  Diagnosis Date  . Depression     lost child 08/2011  . History of syncope     neurocardiogenic per pt  . Generalized headaches     frequent  . Seasonal allergies   . Migraine headache     h/o admission at [facility] for this  . 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 Hx: UTI  (urinary tract infection)   . Lupus anticoagulant positive   . H/O: menorrhagia     to start depo for this  . Obesity     lost >100 lbs, now gained back some, previously ran 2 mi in am 1 in pm  . Allergic rhinitis    Past Surgical History  Procedure Laterality Date  . Exploratory laparotomy  2006    Excessive vaginal bleeding  . Tonsillectomy and adenoidectomy  1998  . Eye surgery  1999  . Cesarean section     Family History  Problem Relation Age of Onset  . Hypertension Mother   . Diabetes Father   . Hypertension Father   . Hyperlipidemia Father   . Heart disease Maternal Grandmother   . Hypertension Maternal Grandmother   . Heart disease Maternal Grandfather   . Lung cancer Maternal Grandfather   . Alcohol abuse Maternal Grandfather   . Arthritis Maternal Grandfather   . Heart disease Paternal Grandmother   . Hypertension Paternal Grandmother   . Breast cancer Paternal Grandmother   . Heart disease Paternal Grandfather   . Coronary artery disease Paternal Grandfather 36  . Alcohol abuse Paternal Grandfather   . Arthritis Paternal Grandfather   . Stroke Neg Hx    History  Substance Use Topics  . Smoking status: Never Smoker   . Smokeless tobacco: Never Used  . Alcohol Use: No   OB History    No data available  Review of Systems  Constitutional: Positive for diaphoresis. Negative for fever, chills, activity change, appetite change and fatigue.  HENT: Negative for congestion, dental problem, drooling, ear discharge, ear pain, facial swelling, hearing loss, mouth sores, nosebleeds, postnasal drip, rhinorrhea, sinus pressure, sneezing, sore throat, tinnitus, trouble swallowing and voice change.   Eyes: Negative for photophobia, pain, discharge, redness, itching and visual disturbance.  Respiratory: Negative for cough, choking, chest tightness, shortness of breath, wheezing and stridor.   Cardiovascular: Positive for chest pain. Negative for palpitations and leg  swelling.  Gastrointestinal: Negative for nausea, vomiting, abdominal pain, diarrhea, constipation, blood in stool and abdominal distention.  Endocrine: Negative for cold intolerance and heat intolerance.  Genitourinary: Negative for flank pain, enuresis and difficulty urinating.  Musculoskeletal: Positive for myalgias and back pain. Negative for joint swelling, arthralgias, gait problem, neck pain and neck stiffness.  Skin: Negative for color change, pallor, rash and wound.  Allergic/Immunologic: Positive for environmental allergies. Negative for food allergies.  Neurological: Positive for headaches. Negative for dizziness, tremors, seizures, syncope, facial asymmetry, speech difficulty, weakness, light-headedness and numbness.  Hematological: Negative for adenopathy. Does not bruise/bleed easily.  Psychiatric/Behavioral: Negative for behavioral problems, confusion, sleep disturbance and agitation.    Allergies  Warfarin sodium  Home Medications   Prior to Admission medications   Medication Sig Start Date End Date Taking? Authorizing Provider  butalbital-acetaminophen-caffeine (FIORICET, ESGIC) 50-325-40 MG per tablet Take 1 tablet by mouth 2 (two) times daily as needed. migraine 05/02/11   Eustaquio Boyden, MD  clonazePAM (KLONOPIN) 1 MG tablet Take 1 mg by mouth daily as needed.     Historical Provider, MD  metaxalone (SKELAXIN) 800 MG tablet Take 1 tablet (800 mg total) by mouth 3 (three) times daily. 02/22/15   Barbaraann Barthel, NP  metoprolol tartrate (LOPRESSOR) 25 MG tablet Take 1 tablet (25 mg total) by mouth 2 (two) times daily. Patient taking differently: Take 50 mg by mouth 2 (two) times daily.  06/19/11   Eustaquio Boyden, MD  Multiple Vitamin (MULTIVITAMIN) tablet Take 1 tablet by mouth daily.      Historical Provider, MD  naproxen (NAPROSYN) 250 MG tablet Take 1 tablet (250 mg total) by mouth 2 (two) times daily with a meal. 02/22/15   Barbaraann Barthel, NP  predniSONE  (DELTASONE) 50 MG tablet Take one tab po if headache not responsive to toradol today 02/22/15   Barbaraann Barthel, NP  sertraline (ZOLOFT) 100 MG tablet Take 200 mg by mouth daily.     Historical Provider, MD   BP 123/91 mmHg  Pulse 89  Temp(Src) 96.7 F (35.9 C) (Tympanic)  Resp 16  Ht 5\' 11"  (1.803 m)  Wt 285 lb (129.275 kg)  BMI 39.77 kg/m2  SpO2 100%  LMP 01/31/2015 (Exact Date) Physical Exam  Constitutional: She is oriented to person, place, and time. Vital signs are normal. She appears well-developed and well-nourished. She is active.  Non-toxic appearance. She does not have a sickly appearance. She does not appear ill. No distress.  HENT:  Head: Normocephalic and atraumatic.  Right Ear: External ear normal.  Left Ear: External ear normal.  Nose: Nose normal.  Mouth/Throat: Oropharynx is clear and moist. No oropharyngeal exudate.  Bilateral TMs with air fluid level; erythema posterior pharynx  Eyes: Conjunctivae, EOM and lids are normal. Pupils are equal, round, and reactive to light. Right eye exhibits no discharge. Left eye exhibits no discharge. No scleral icterus.  Neck: Trachea normal and normal range of motion. Neck supple.  No tracheal deviation present. No thyromegaly present.  Cardiovascular: Normal rate, regular rhythm, normal heart sounds and intact distal pulses.  Exam reveals no gallop and no friction rub.   No murmur heard. Pulmonary/Chest: Effort normal and breath sounds normal. No stridor. No respiratory distress. She has no wheezes. She has no rales. She exhibits no tenderness.  Abdominal: Soft. Bowel sounds are normal. She exhibits no distension. There is no tenderness.  Musculoskeletal: Normal range of motion. She exhibits edema.       Right wrist: She exhibits swelling. She exhibits normal range of motion, no tenderness, no bony tenderness, no effusion, no crepitus, no deformity and no laceration.       Cervical back: She exhibits tenderness and spasm. She  exhibits normal range of motion, no bony tenderness, no swelling, no edema, no deformity, no laceration, no pain and normal pulse.  0-1+/4 right hand edema; negative tinnels'/phalens bilateral hands; bilateral trapezius muscles tight/TTP  Lymphadenopathy:    She has no cervical adenopathy.  Neurological: She is alert and oriented to person, place, and time. She has normal reflexes. She displays normal reflexes. No cranial nerve deficit. She exhibits normal muscle tone. Coordination normal.  Skin: Skin is warm, dry and intact. No abrasion, no bruising, no burn, no ecchymosis, no laceration, no lesion, no petechiae and no rash noted. She is not diaphoretic. No cyanosis or erythema. No pallor. Nails show no clubbing.  Healing abrasion left ring finger posterior  Psychiatric: She has a normal mood and affect. Her speech is normal and behavior is normal. Judgment and thought content normal. Cognition and memory are normal.  Nursing note and vitals reviewed.   ED Course  Procedures (including critical care time) Labs Review Labs Reviewed - No data to display  Imaging Review No results found.  Patient reported slight improvement headache after toradol injection.  Verbalized understanding to hold aleve x 8 hours take pm dose later today due to toradol injection.  Patient given paper Rx if worsening headache to try prednisone  po x 1 pulse dose.  Patient verbalized understanding of information/instructions, agreed with plan of care and had no further questions at this time. MDM   1. Headache behind the eyes   2. Hand pain, right   3. Trapezius muscle spasm    For acute pain, rest, and intermittent application of heat, analgesics, and PRN po NSAIDS.  Avoid known triggers e.g. sleep deprivation, foods, stress, dehydration.  If headache is the worst headache of entire life and came on like a clap of thunder patient was instructed to go to the Emergency Room.  Call or return to clinic as needed if  these symptoms worsen or fail to improve as anticipated.  Exitcare handout on headaches given to patient and patient also instructed to maintain headache log.  Patient verbalized agreement and understanding of treatment plan. P2:  Diet and fitness.  Patient has new diet/water intake.  To continue monitor symptoms and if they occur on low water or high water intake day.  Has tried to decrease prepackaged foods/added salt from diet and her body may be more sensitive on days "salt cheating or low water intake" occurs. Discussed with patient carpal tunnel symptoms can occur with swelling of wrist/hand or after more repetitive hand movements/wrist flexion.  Patient with history of carpal tunnel symptoms during pregnancy.  Right hand dominant has OTC splint at home but has not used since delivery/end of pregnancy.  Monitor if symptoms worse after sleeping due to hand flexion.  Follow up with PCM with symptom log if hand swelling continues.  Patient verbalized understanding of information/instructions, agreed with plan of care and had no further questions at this time.  Work excuse x 24 hours.  May continue aleve OTC 1-2 tabs po BID.  Skelaxin po TID prn muscle spasms.  Home stretches demonstrated to patient-e.g. Arm circles, walking hand up wall, chest stretches, neck AROM, chin tucks.  Has wrist splint at home discussed trial 2 week use.  Consider physical therapy referral if no improvement with prescribed therapy.  Ensure ergonomics correct desk at work avoid repetitive motions if possible/holding phone/laptop in hand use desk/stand or alternate hand use for activity. Patient was instructed to rest, ice, and ROM exercises.  Activity as tolerated.  Avoid alcohol intake while taking skelaxin.  Follow up if symptoms persist or worsen.  Exitcare handout on carpal tunnel, neck pain, muscle spasms given to patient.  Suspect overuse soft tissue injury related pain due to increased work hours/schedule.  Call or return to  clinic as needed if these symptoms worsen or fail to improve as anticipated and will consider PT/orthopedics evaluation.  Patient verbalized agreement and understanding of treatment plan.  P2:  ROM, injury prevention   Barbaraann Barthel, NP 02/22/15 1454  Barbaraann Barthel, NP 02/22/15 1456

## 2015-02-22 NOTE — ED Notes (Signed)
Patient reports swelling in her right hand and pain for the past week.  Patient denies fevers.  Patitent denies N/V.  Patient denies recent tick bites.

## 2015-02-22 NOTE — Discharge Instructions (Signed)
Tension Headache A tension headache is a feeling of pain, pressure, or aching often felt over the front and sides of the head. The pain can be dull or can feel tight (constricting). It is the most common type of headache. Tension headaches are not normally associated with nausea or vomiting and do not get worse with physical activity. Tension headaches can last 30 minutes to several days.  CAUSES  The exact cause is not known, but it may be caused by chemicals and hormones in the brain that lead to pain. Tension headaches often begin after stress, anxiety, or depression. Other triggers may include:  Alcohol.  Caffeine (too much or withdrawal).  Respiratory infections (colds, flu, sinus infections).  Dental problems or teeth clenching.  Fatigue.  Holding your head and neck in one position too long while using a computer. SYMPTOMS   Pressure around the head.   Dull, aching head pain.   Pain felt over the front and sides of the head.   Tenderness in the muscles of the head, neck, and shoulders. DIAGNOSIS  A tension headache is often diagnosed based on:   Symptoms.   Physical examination.   A CT scan or MRI of your head. These tests may be ordered if symptoms are severe or unusual. TREATMENT  Medicines may be given to help relieve symptoms.  HOME CARE INSTRUCTIONS   Only take over-the-counter or prescription medicines for pain or discomfort as directed by your caregiver.   Lie down in a dark, quiet room when you have a headache.   Keep a journal to find out what may be triggering your headaches. For example, write down:  What you eat and drink.  How much sleep you get.  Any change to your diet or medicines.  Try massage or other relaxation techniques.   Ice packs or heat applied to the head and neck can be used. Use these 3 to 4 times per day for 15 to 20 minutes each time, or as needed.   Limit stress.   Sit up straight, and do not tense your muscles.    Quit smoking if you smoke.  Limit alcohol use.  Decrease the amount of caffeine you drink, or stop drinking caffeine.  Eat and exercise regularly.  Get 7 to 9 hours of sleep, or as recommended by your caregiver.  Avoid excessive use of pain medicine as recurrent headaches can occur.  SEEK MEDICAL CARE IF:   You have problems with the medicines you were prescribed.  Your medicines do not work.  You have a change from the usual headache.  You have nausea or vomiting. SEEK IMMEDIATE MEDICAL CARE IF:   Your headache becomes severe.  You have a fever.  You have a stiff neck.  You have loss of vision.  You have muscular weakness or loss of muscle control.  You lose your balance or have trouble walking.  You feel faint or pass out.  You have severe symptoms that are different from your first symptoms. MAKE SURE YOU:   Understand these instructions.  Will watch your condition.  Will get help right away if you are not doing well or get worse. Document Released: 08/18/2005 Document Revised: 11/10/2011 Document Reviewed: 08/08/2011 [facility] Patient Information 2015 [place], [place]. This information is not intended to replace advice given to you by your health care provider. Make sure you discuss any questions you have with your health care provider. Carpal Tunnel Syndrome Carpal tunnel syndrome is a disorder of the nervous system  in the wrist that causes pain, hand weakness, and/or loss of feeling. Carpal tunnel syndrome is caused by the compression, stretching, or irritation of the median nerve at the wrist joint. Athletes who experience carpal tunnel syndrome may notice a decrease in their performance to the condition, especially for sports that require strong hand or wrist action.  SYMPTOMS   Tingling, numbness, or burning pain in the hand or fingers.  Inability to sleep due to pain in the hand.  Sharp pains that shoot from the wrist up the arm or to the fingers,  especially at night.  Morning stiffness or cramping of the hand.  Thumb weakness, resulting in difficulty holding objects or making a fist.  Shiny, dry skin on the hand.  Reduced performance in any sport requiring a strong grip. CAUSES   Median nerve damage at the wrist is caused by pressure due to swelling, inflammation, or scarred tissue.  Sources of pressure include:  Repetitive gripping or squeezing that causes inflammation of the tendon sheaths.  Scarring or shortening of the ligament that covers the median nerve.  Traumatic injury to the wrist or forearm such as fracture, sprain, or dislocation.  Prolonged hyperextension (wrist bent backward) or hyperflexion (wrist bent downward) of the wrist. RISK INCREASES WITH:  Diabetes mellitus.  Menopause or amenorrhea.  Rheumatoid arthritis.  Raynaud disease.  Pregnancy.  Gout.  Kidney disease.  Ganglion cyst.  Repetitive hand or wrist action.  Hypothyroidism (underactive thyroid gland).  Repetitive jolting or shaking of the hands or wrist.  Prolonged forceful weight-bearing on the hands. PREVENTION  Bracing the hand and wrist straight during activities that involve repetitive grasping.  For activities that require prolonged extension of the wrist (bending towards the top of the forearm) periodically change the position of your wrists.  Learn and use proper technique in activities that result in the wrist position in neutral to slight extension.  Avoid bending the wrist into full extension or flexion (up or down).  Keep the wrist in a straight (neutral) position. To keep the wrist in this position, wear a splint.  Avoid repetitive hand and wrist motions.  When possible avoid prolonged grasping of items (steering wheel of a car, a pen, a vacuum cleaner, or a rake).  Loosen your grip for activities that require prolonged grasping of items.  Place keyboards and writing surfaces at the correct height as to  decrease strain on the wrist and hand.  Alternate work tasks to avoid prolonged wrist flexion.  Avoid pinching activities (needlework and writing) as they may irritate your carpal tunnel syndrome.  If these activities are necessary, complete them for shorter periods of time.  When writing, use a felt tip or rollerball pen and/or build up the grip on a pen to decrease the forces required for writing. PROGNOSIS  Carpal tunnel syndrome is usually curable with appropriate conservative treatment and sometimes resolves spontaneously. For some cases, surgery is necessary, especially if muscle wasting or nerve changes have developed.  RELATED COMPLICATIONS   Permanent numbness and a weak thumb or fingers in the affected hand.  Permanent paralysis of a portion of the hand and fingers. TREATMENT  Treatment initially consists of stopping activities that aggravate the symptoms as well as medication and ice to reduce inflammation. A wrist splint is often recommended for wear during activities of repetitive motion as well as at night. It is also important to learn and use proper technique when performing activities that typically cause pain. On occasion, a corticosteroid injection may  be given. If symptoms persist despite conservative treatment, surgery may be an option. Surgical techniques free the pinched or compressed nerve. Carpal tunnel surgery is usually performed on an outpatient basis, meaning you go home the same day as surgery. These procedures provide almost complete relief of all symptoms in 95% of patients. Expect at least 2 weeks for healing after surgery. For cases that are the result of repeated jolting or shaking of the hand or wrist or prolonged hyperextension, surgery is not usually recommended because stretching of the median nerve, not compression, is usually the cause of carpal tunnel syndrome in these cases. MEDICATION   If pain medication is necessary, nonsteroidal anti-inflammatory  medications, such as aspirin and ibuprofen, or other minor pain relievers, such as acetaminophen, are often recommended.  Do not take pain medication for 7 days before surgery.  Prescription pain relievers are usually only prescribed after surgery. Use only as directed and only as much as you need.  Corticosteroid injections may be given to reduce inflammation. However, they are not always recommended.  Vitamin B6 (pyridoxine) may reduce symptoms; use only if prescribed for your disorder. SEEK MEDICAL CARE IF:   Symptoms get worse or do not improve in 2 weeks despite treatment.  You also have a current or recent history of neck or shoulder injury that has resulted in pain or tingling elsewhere in your arm. Document Released: 08/18/2005 Document Revised: 01/02/2014 Document Reviewed: 11/30/2008 [facility] Patient Information 2015 [place], [place]. This information is not intended to replace advice given to you by your health care provider. Make sure you discuss any questions you have with your health care provider.

## 2015-05-02 ENCOUNTER — Other Ambulatory Visit: Payer: Self-pay

## 2015-05-03 ENCOUNTER — Encounter: Payer: Self-pay | Admitting: Gastroenterology

## 2015-05-03 ENCOUNTER — Other Ambulatory Visit: Payer: Self-pay

## 2015-05-03 ENCOUNTER — Ambulatory Visit (INDEPENDENT_AMBULATORY_CARE_PROVIDER_SITE_OTHER): Payer: BLUE CROSS/BLUE SHIELD | Admitting: Gastroenterology

## 2015-05-03 VITALS — BP 128/87 | HR 86 | Temp 98.5°F | Ht 70.0 in | Wt 294.0 lb

## 2015-05-03 DIAGNOSIS — K921 Melena: Secondary | ICD-10-CM

## 2015-05-03 DIAGNOSIS — R197 Diarrhea, unspecified: Secondary | ICD-10-CM | POA: Diagnosis not present

## 2015-05-03 NOTE — Progress Notes (Signed)
Gastroenterology Consultation  Referring Provider:     Jaclyn Shaggy, MD Primary Care Physician:  Jaclyn Shaggy, MD Primary Gastroenterologist:  Dr. Servando Snare     Reason for Consultation:     Bloody diarrhea        HPI:   Debra Terry is a 29 y.o. y/o female referred for consultation & management of any diarrhea by Dr. Jaclyn Shaggy, MD.  This patient comes today with a history of approximate 1-2 years of bloody diarrhea. The patient reports that before that she had no rectal bleeding but states that she has bleeding frequently with diarrhea every one to 1-1/2 hours when she has her attacks. The attacks usually last 2-3 weeks and then she may be constipated after that. The patient denies any unexplained weight loss. The patient does have a father who has ulcerative colitis which made her concerned about her having ulcerative colitis. She denies any bilateral joint pain but states that she had some swelling in one of her wrist that she was seen by a physician for without any recurrence of this. The patient also denies any nausea vomiting. There is no report of any decreased appetite or black stools. She reports that now she is in a period of her life that she is more constipated than having diarrhea.  Past Medical History  Diagnosis Date  . Depression     lost child 08/2011  . History of syncope     neurocardiogenic per pt  . Generalized headaches     frequent  . Seasonal allergies   . Migraine headache     h/o admission at [facility] for this  . 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 Hx: UTI (urinary tract infection)   . Lupus anticoagulant positive   . H/O: menorrhagia     to start depo for this  . Obesity     lost >100 lbs, now gained back some, previously ran 2 mi in am 1 in pm  . Allergic rhinitis     Past Surgical History  Procedure Laterality Date  . Exploratory laparotomy  2006    Excessive  vaginal bleeding  . Tonsillectomy and adenoidectomy  1998  . Eye surgery  1999  . Cesarean section      Prior to Admission medications   Medication Sig Start Date End Date Taking? Authorizing Provider  butalbital-acetaminophen-caffeine (FIORICET, ESGIC) 50-325-40 MG per tablet Take 1 tablet by mouth 2 (two) times daily as needed. migraine 05/02/11  Yes Eustaquio Boyden, MD  clonazePAM (KLONOPIN) 0.5 MG tablet TK 1 T PO BID PRF ANXIETY 01/25/15  Yes Historical Provider, MD  fexofenadine (ALLEGRA) 180 MG tablet Take by mouth. 08/22/10  Yes Historical Provider, MD  metoprolol tartrate (LOPRESSOR) 25 MG tablet Take 1 tablet (25 mg total) by mouth 2 (two) times daily. Patient taking differently: Take 50 mg by mouth 2 (two) times daily.  06/19/11  Yes Eustaquio Boyden, MD  Multiple Vitamin (MULTIVITAMIN) tablet Take 1 tablet by mouth daily.     Yes Historical Provider, MD  naproxen (NAPROSYN) 250 MG tablet Take 1 tablet (250 mg total) by mouth 2 (two) times daily with a meal. 02/22/15  Yes Barbaraann Barthel, NP  sertraline (ZOLOFT) 100 MG tablet Take 200 mg by mouth daily.    Yes Historical Provider, MD  ALPRAZolam Prudy Feeler) 0.25 MG tablet Take by mouth. 08/22/10  Historical Provider, MD  clonazePAM (KLONOPIN) 1 MG tablet Take 1 mg by mouth daily as needed.     Historical Provider, MD  metaxalone (SKELAXIN) 800 MG tablet Take 1 tablet (800 mg total) by mouth 3 (three) times daily. Patient not taking: Reported on 05/03/2015 02/22/15   Barbaraann Barthel, NP  phentermine 30 MG capsule Take by mouth.    Historical Provider, MD  predniSONE (DELTASONE) 50 MG tablet Take one tab po if headache not responsive to toradol today Patient not taking: Reported on 05/03/2015 02/22/15   Barbaraann Barthel, NP    Family History  Problem Relation Age of Onset  . Hypertension Mother   . Diabetes Father   . Hypertension Father   . Hyperlipidemia Father   . Heart disease Maternal Grandmother   . Hypertension Maternal  Grandmother   . Heart disease Maternal Grandfather   . Lung cancer Maternal Grandfather   . Alcohol abuse Maternal Grandfather   . Arthritis Maternal Grandfather   . Heart disease Paternal Grandmother   . Hypertension Paternal Grandmother   . Breast cancer Paternal Grandmother   . Heart disease Paternal Grandfather   . Coronary artery disease Paternal Grandfather 68  . Alcohol abuse Paternal Grandfather   . Arthritis Paternal Grandfather   . Stroke Neg Hx   . Colitis Father      Social History  Substance Use Topics  . Smoking status: Never Smoker   . Smokeless tobacco: Never Used  . Alcohol Use: No    Allergies as of 05/03/2015 - Review Complete 05/03/2015  Allergen Reaction Noted  . Warfarin sodium  02/06/2011    Review of Systems:    All systems reviewed and negative except where noted in HPI.   Physical Exam:  BP 128/87 mmHg  Pulse 86  Temp(Src) 98.5 F (36.9 C) (Oral)  Ht  (1.778 m)  Wt 294 lb (133.358 kg)  BMI 42.18 kg/m2 No LMP recorded. Psych:  Alert and cooperative. Normal mood and affect. General:   Alert,  Well-developed, obese, well-nourished, pleasant and cooperative in NAD Head:  Normocephalic and atraumatic. Eyes:  Sclera clear, no icterus.   Conjunctiva pink. Ears:  Normal auditory acuity. Nose:  No deformity, discharge, or lesions. Mouth:  No deformity or lesions,oropharynx pink & moist. Neck:  Supple; no masses or thyromegaly. Lungs:  Respirations even and unlabored.  Clear throughout to auscultation.   No wheezes, crackles, or rhonchi. No acute distress. Heart:  Regular rate and rhythm; no murmurs, clicks, rubs, or gallops. Abdomen:  Normal bowel sounds.  No bruits.  Soft, non-tender and non-distended without masses, hepatosplenomegaly or hernias noted.  No guarding or rebound tenderness.  Negative Carnett sign.   Rectal:  Deferred.  Msk:  Symmetrical without gross deformities.  Good, equal movement & strength bilaterally. Pulses:  Normal  pulses noted. Extremities:  No clubbing or edema.  No cyanosis. Neurologic:  Alert and oriented x3;  grossly normal neurologically. Skin:  Intact without significant lesions or rashes.  No jaundice. Lymph Nodes:  No significant cervical adenopathy. Psych:  Alert and cooperative. Normal mood and affect.  Imaging Studies: No results found.  Assessment and Plan:   Debra Terry is a 29 y.o. y/o female reports a few years of rectal bleeding with diarrhea. She states that the diarrhea comes in episodes that lasts weeks and then resolve with resulting constipation. She also reports that she has had rectal bleeding during the diarrhea with bright red blood. There is no report  of any abdominal pain unexplained weight loss fevers or chills. The patient's father does have ulcerative colitis and the patient were that she may have ulcerative colitis. She will be set up for a colonoscopy to look for any source of the rectal bleeding and diarrhea and to rule out inflammatory bowel disease. The patient has been explained the plan and agrees with it.I have discussed risks & benefits which include, but are not limited to, bleeding, infection, perforation & drug reaction.  The patient agrees with this plan & written consent will be obtained.      Note: This dictation was prepared with Dragon dictation along with smaller phrase technology. Any transcriptional errors that result from this process are unintentional.

## 2015-05-09 ENCOUNTER — Encounter: Payer: Self-pay | Admitting: *Deleted

## 2015-05-14 NOTE — Discharge Instructions (Signed)

## 2015-05-16 ENCOUNTER — Ambulatory Visit
Admission: RE | Admit: 2015-05-16 | Discharge: 2015-05-16 | Disposition: A | Discharge status: Home / Self Care | Payer: BLUE CROSS/BLUE SHIELD | Source: Ambulatory Visit | Attending: Gastroenterology | Admitting: Gastroenterology

## 2015-05-16 ENCOUNTER — Encounter
Admission: RE | Disposition: A | Discharge status: Home / Self Care | Payer: Self-pay | Source: Ambulatory Visit | Attending: Gastroenterology

## 2015-05-16 ENCOUNTER — Ambulatory Visit: Payer: BLUE CROSS/BLUE SHIELD | Admitting: Anesthesiology

## 2015-05-16 ENCOUNTER — Other Ambulatory Visit: Payer: Self-pay | Admitting: Gastroenterology

## 2015-05-16 DIAGNOSIS — K921 Melena: Secondary | ICD-10-CM | POA: Diagnosis not present

## 2015-05-16 DIAGNOSIS — Z8261 Family history of arthritis: Secondary | ICD-10-CM | POA: Diagnosis not present

## 2015-05-16 DIAGNOSIS — Z79899 Other long term (current) drug therapy: Secondary | ICD-10-CM | POA: Diagnosis not present

## 2015-05-16 DIAGNOSIS — J309 Allergic rhinitis, unspecified: Secondary | ICD-10-CM | POA: Diagnosis not present

## 2015-05-16 DIAGNOSIS — Z87440 Personal history of urinary (tract) infections: Secondary | ICD-10-CM | POA: Diagnosis not present

## 2015-05-16 DIAGNOSIS — E669 Obesity, unspecified: Secondary | ICD-10-CM | POA: Diagnosis not present

## 2015-05-16 DIAGNOSIS — Z803 Family history of malignant neoplasm of breast: Secondary | ICD-10-CM | POA: Diagnosis not present

## 2015-05-16 DIAGNOSIS — Z7952 Long term (current) use of systemic steroids: Secondary | ICD-10-CM | POA: Insufficient documentation

## 2015-05-16 DIAGNOSIS — Z801 Family history of malignant neoplasm of trachea, bronchus and lung: Secondary | ICD-10-CM | POA: Insufficient documentation

## 2015-05-16 DIAGNOSIS — Z8379 Family history of other diseases of the digestive system: Secondary | ICD-10-CM | POA: Diagnosis not present

## 2015-05-16 DIAGNOSIS — R197 Diarrhea, unspecified: Secondary | ICD-10-CM | POA: Diagnosis present

## 2015-05-16 DIAGNOSIS — Z7982 Long term (current) use of aspirin: Secondary | ICD-10-CM | POA: Insufficient documentation

## 2015-05-16 DIAGNOSIS — K640 First degree hemorrhoids: Secondary | ICD-10-CM | POA: Insufficient documentation

## 2015-05-16 DIAGNOSIS — Z823 Family history of stroke: Secondary | ICD-10-CM | POA: Insufficient documentation

## 2015-05-16 DIAGNOSIS — Z9889 Other specified postprocedural states: Secondary | ICD-10-CM | POA: Insufficient documentation

## 2015-05-16 DIAGNOSIS — Z833 Family history of diabetes mellitus: Secondary | ICD-10-CM | POA: Insufficient documentation

## 2015-05-16 DIAGNOSIS — Z811 Family history of alcohol abuse and dependence: Secondary | ICD-10-CM | POA: Diagnosis not present

## 2015-05-16 DIAGNOSIS — Z888 Allergy status to other drugs, medicaments and biological substances status: Secondary | ICD-10-CM | POA: Diagnosis not present

## 2015-05-16 DIAGNOSIS — D6862 Lupus anticoagulant syndrome: Secondary | ICD-10-CM | POA: Insufficient documentation

## 2015-05-16 DIAGNOSIS — Z86711 Personal history of pulmonary embolism: Secondary | ICD-10-CM | POA: Insufficient documentation

## 2015-05-16 DIAGNOSIS — Z8249 Family history of ischemic heart disease and other diseases of the circulatory system: Secondary | ICD-10-CM | POA: Diagnosis not present

## 2015-05-16 DIAGNOSIS — F329 Major depressive disorder, single episode, unspecified: Secondary | ICD-10-CM | POA: Insufficient documentation

## 2015-05-16 HISTORY — DX: Adverse effect of unspecified anesthetic, initial encounter: T41.45XA

## 2015-05-16 HISTORY — PX: COLONOSCOPY WITH PROPOFOL: SHX5780

## 2015-05-16 HISTORY — DX: Presence of spectacles and contact lenses: Z97.3

## 2015-05-16 HISTORY — DX: Other complications of anesthesia, initial encounter: T88.59XA

## 2015-05-16 HISTORY — DX: Motion sickness, initial encounter: T75.3XXA

## 2015-05-16 SURGERY — COLONOSCOPY WITH PROPOFOL
Anesthesia: Monitor Anesthesia Care | Wound class: Contaminated

## 2015-05-16 MED ORDER — OXYCODONE HCL 5 MG/5ML PO SOLN: 5.0000 mg | Freq: Once | ORAL | Status: DC | PRN

## 2015-05-16 MED ORDER — ACETAMINOPHEN 325 MG PO TABS: 325.0000 mg | ORAL_TABLET | ORAL | Status: DC | PRN

## 2015-05-16 MED ORDER — OXYCODONE HCL 5 MG PO TABS
5.0000 mg | ORAL_TABLET | Freq: Once | ORAL | Status: DC | PRN
Start: 2015-05-16 — End: 2015-05-16

## 2015-05-16 MED ORDER — STERILE WATER FOR IRRIGATION IR SOLN
Status: DC | PRN
  Administered 2015-05-16: 09:00:00

## 2015-05-16 MED ORDER — FENTANYL CITRATE (PF) 100 MCG/2ML IJ SOLN: 25.0000 ug | INTRAMUSCULAR | Status: DC | PRN

## 2015-05-16 MED ORDER — LACTATED RINGERS IV SOLN
INTRAVENOUS | Status: DC
  Administered 2015-05-16 (×2): via INTRAVENOUS

## 2015-05-16 MED ORDER — PROPOFOL 10 MG/ML IV BOLUS
INTRAVENOUS | Status: DC | PRN
  Administered 2015-05-16: 30 mg via INTRAVENOUS
  Administered 2015-05-16: 50 mg via INTRAVENOUS
  Administered 2015-05-16 (×2): 100 mg via INTRAVENOUS

## 2015-05-16 MED ORDER — LIDOCAINE HCL (CARDIAC) 20 MG/ML IV SOLN
INTRAVENOUS | Status: DC | PRN
  Administered 2015-05-16: 20 mg via INTRAVENOUS

## 2015-05-16 MED ORDER — LACTATED RINGERS IV SOLN: 500.0000 mL | INTRAVENOUS | Status: DC

## 2015-05-16 MED ORDER — ACETAMINOPHEN 160 MG/5ML PO SOLN: 325.0000 mg | ORAL | Status: DC | PRN

## 2015-05-16 MED ORDER — DEXAMETHASONE SODIUM PHOSPHATE 4 MG/ML IJ SOLN: 8.0000 mg | Freq: Once | INTRAMUSCULAR | Status: DC | PRN

## 2015-05-16 SURGICAL SUPPLY — 28 items
CANISTER SUCT 1200ML W/VALVE (MISCELLANEOUS) ×3 IMPLANT
FCP ESCP3.2XJMB 240X2.8X (MISCELLANEOUS)
FORCEPS BIOP RAD 4 LRG CAP 4 (CUTTING FORCEPS) ×2 IMPLANT
FORCEPS BIOP RJ4 240 W/NDL (MISCELLANEOUS)
FORCEPS ESCP3.2XJMB 240X2.8X (MISCELLANEOUS) IMPLANT
GOWN CVR UNV OPN BCK APRN NK (MISCELLANEOUS) ×2 IMPLANT
GOWN ISOL THUMB LOOP REG UNIV (MISCELLANEOUS) ×6
HEMOCLIP INSTINCT (CLIP) IMPLANT
INJECTOR VARIJECT VIN23 (MISCELLANEOUS) IMPLANT
KIT CO2 TUBING (TUBING) IMPLANT
KIT DEFENDO VALVE AND CONN (KITS) IMPLANT
KIT ENDO PROCEDURE OLY (KITS) ×3 IMPLANT
LIGATOR MULTIBAND 6SHOOTER MBL (MISCELLANEOUS) IMPLANT
MARKER SPOT ENDO TATTOO 5ML (MISCELLANEOUS) IMPLANT
PAD GROUND ADULT SPLIT (MISCELLANEOUS) IMPLANT
SNARE SHORT THROW 13M SML OVAL (MISCELLANEOUS) IMPLANT
SNARE SHORT THROW 30M LRG OVAL (MISCELLANEOUS) IMPLANT
SPOT EX ENDOSCOPIC TATTOO (MISCELLANEOUS)
SUCTION POLY TRAP 4CHAMBER (MISCELLANEOUS) IMPLANT
TRAP SUCTION POLY (MISCELLANEOUS) IMPLANT
TUBING CONN 6MMX3.1M (TUBING)
TUBING SUCTION CONN 0.25 STRL (TUBING) IMPLANT
UNDERPAD 30X60 958B10 (PK) (MISCELLANEOUS) IMPLANT
VALVE BIOPSY ENDO (VALVE) IMPLANT
VARIJECT INJECTOR VIN23 (MISCELLANEOUS)
WATER AUXILLARY (MISCELLANEOUS) IMPLANT
WATER STERILE IRR 250ML POUR (IV SOLUTION) ×3 IMPLANT
WATER STERILE IRR 500ML POUR (IV SOLUTION) IMPLANT

## 2015-05-16 NOTE — Op Note (Signed)
[facility] Gastroenterology Patient Name: Debra Terry Procedure Date: 05/16/2015 8:40 AM MRN: [id number] Account #: [id number] Date of Birth: 04-May-1986 Admit Type: Outpatient Age: 29 Room: [facility] OR ROOM 01 Gender: Female Note Status: Finalized Procedure:         Colonoscopy Indications:       Chronic diarrhea, Hematochezia Providers:         Midge Minium, MD Referring MD:      Jillene Bucks. Arlana Pouch, MD (Referring MD) Medicines:         Propofol per Anesthesia Complications:     No immediate complications. Procedure:         Pre-Anesthesia Assessment:                    - Prior to the procedure, a History and Physical was                     performed, and patient medications and allergies were                     reviewed. The patient's tolerance of previous anesthesia                     was also reviewed. The risks and benefits of the procedure                     and the sedation options and risks were discussed with the                     patient. All questions were answered, and informed consent                     was obtained. Prior Anticoagulants: The patient has taken                     no previous anticoagulant or antiplatelet agents. ASA                     Grade Assessment: II - A patient with mild systemic                     disease. After reviewing the risks and benefits, the                     patient was deemed in satisfactory condition to undergo                     the procedure.                    After obtaining informed consent, the colonoscope was                     passed under direct vision. Throughout the procedure, the                     patient's blood pressure, pulse, and oxygen saturations                     were monitored continuously. The Olympus CF H180AL                     colonoscope (S#: [id number]) was introduced through the anus  and advanced to the the terminal ileum. The colonoscopy                     was  performed without difficulty. The patient tolerated                     the procedure well. The quality of the bowel preparation                     was good. Findings:      The perianal and digital rectal examinations were normal.      The terminal ileum appeared normal. Biopsies were taken with a cold       forceps for histology.      Non-bleeding internal hemorrhoids were found during retroflexion. The       hemorrhoids were Grade I (internal hemorrhoids that do not prolapse).      Biopsies were taken with a cold forceps in the entire colon for       histology. Impression:        - The examined portion of the ileum was normal. Biopsied.                    - Non-bleeding internal hemorrhoids.                    - Biopsies were taken with a cold forceps for histology in                     the entire colon. Recommendation:    - Await pathology results. Procedure Code(s): --- Professional ---                    [phone], Colonoscopy, flexible; with biopsy, single or                     multiple Diagnosis Code(s): --- Professional ---                    K92.1, Melena                    K52.9, Noninfective gastroenteritis and colitis,                     unspecified                    K64.0, First degree hemorrhoids CPT copyright 2014 [employer]. All rights reserved. The codes documented in this report are preliminary and upon coder review may  be revised to meet current compliance requirements. Midge Minium, MD 05/16/2015 9:02:25 AM This report has been signed electronically. Number of Addenda: 0 Note Initiated On: 05/16/2015 8:40 AM Scope Withdrawal Time: 0 hours 6 minutes 26 seconds  Total Procedure Duration: 0 hours 8 minutes 45 seconds       [facility]

## 2015-05-16 NOTE — Transfer of Care (Signed)
Immediate Anesthesia Transfer of Care Note  Patient: Debra Terry  Procedure(s) Performed: Procedure(s): COLONOSCOPY WITH PROPOFOL (N/A)  Patient Location: PACU  Anesthesia Type: MAC  Level of Consciousness: awake, alert  and patient cooperative  Airway and Oxygen Therapy: Patient Spontanous Breathing and Patient connected to supplemental oxygen  Post-op Assessment: Post-op Vital signs reviewed, Patient's Cardiovascular Status Stable, Respiratory Function Stable, Patent Airway and No signs of Nausea or vomiting  Post-op Vital Signs: Reviewed and stable  Complications: No apparent anesthesia complications

## 2015-05-16 NOTE — Anesthesia Postprocedure Evaluation (Signed)
  Anesthesia Post-op Note  Patient: Debra Terry  Procedure(s) Performed: Procedure(s): COLONOSCOPY WITH PROPOFOL (N/A)  Anesthesia type:MAC  Patient location: PACU  Post pain: Pain level controlled  Post assessment: Post-op Vital signs reviewed, Patient's Cardiovascular Status Stable, Respiratory Function Stable, Patent Airway and No signs of Nausea or vomiting  Post vital signs: Reviewed and stable  Last Vitals:  Filed Vitals:   05/16/15 0904  BP:   Pulse: 85  Temp: 36.3 C  Resp: 18    Level of consciousness: awake, alert  and patient cooperative  Complications: No apparent anesthesia complications

## 2015-05-16 NOTE — Anesthesia Preprocedure Evaluation (Signed)
Anesthesia Evaluation  Patient identified by MRN, date of birth, ID band Patient awake    Reviewed: Allergy & Precautions, H&P , NPO status , Patient's Chart, lab work & pertinent test results, reviewed documented beta blocker date and time   History of Anesthesia Complications (+) AWARENESS UNDER ANESTHESIA and history of anesthetic complications  Airway Mallampati: II  TM Distance: >3 FB Neck ROM: full    Dental no notable dental hx.    Pulmonary neg pulmonary ROS,    Pulmonary exam normal breath sounds clear to auscultation       Cardiovascular Exercise Tolerance: Good negative cardio ROS   Rhythm:regular Rate:Normal     Neuro/Psych  Headaches, PSYCHIATRIC DISORDERS    GI/Hepatic negative GI ROS, Neg liver ROS,   Endo/Other  negative endocrine ROS  Renal/GU negative Renal ROS  negative genitourinary   Musculoskeletal   Abdominal   Peds  Hematology negative hematology ROS (+)   Anesthesia Other Findings   Reproductive/Obstetrics negative OB ROS                             Anesthesia Physical Anesthesia Plan  ASA: III  Anesthesia Plan: MAC   Post-op Pain Management:    Induction:   Airway Management Planned:   Additional Equipment:   Intra-op Plan:   Post-operative Plan:   Informed Consent: I have reviewed the patients History and Physical, chart, labs and discussed the procedure including the risks, benefits and alternatives for the proposed anesthesia with the patient or authorized representative who has indicated his/her understanding and acceptance.     Plan Discussed with: CRNA  Anesthesia Plan Comments:         Anesthesia Quick Evaluation

## 2015-05-16 NOTE — H&P (Signed)
[facility]  [address] [place], [place] [address] Phone: [phone] Fax : [phone]  Primary Care Physician:  Jaclyn Shaggy, MD Primary Gastroenterologist:  Dr. Servando Snare  Pre-Procedure History & Physical: HPI:  Debra Terry is a 29 y.o. female is here for an colonoscopy.   Past Medical History  Diagnosis Date  . Depression     lost child 08/2011  . History of syncope     neurocardiogenic per pt  . Generalized headaches     frequent  . Seasonal allergies   . Migraine headache     h/o admission at [facility] for this  . 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 Hx: UTI (urinary tract infection)   . Lupus anticoagulant positive   . H/O: menorrhagia     to start depo for this  . Obesity     lost >100 lbs, now gained back some, previously ran 2 mi in am 1 in pm  . Allergic rhinitis   . Complication of anesthesia     woke during T&A  . Motion sickness     all moving vehicles  . Wears contact lenses     Past Surgical History  Procedure Laterality Date  . Exploratory laparotomy  2006    Excessive vaginal bleeding  . Tonsillectomy and adenoidectomy  1998  . Eye surgery  1999  . Cesarean section      Prior to Admission medications   Medication Sig Start Date End Date Taking? Authorizing Provider  Multiple Vitamins-Minerals (MULTIVITAMIN PO) Take by mouth.   Yes Historical Provider, MD  ALPRAZolam Prudy Feeler) 0.25 MG tablet Take by mouth. 08/22/10   Historical Provider, MD  butalbital-acetaminophen-caffeine (FIORICET, ESGIC) 50-325-40 MG per tablet Take 1 tablet by mouth 2 (two) times daily as needed. migraine 05/02/11   Eustaquio Boyden, MD  clonazePAM (KLONOPIN) 0.5 MG tablet TK 1 T PO BID PRF ANXIETY 01/25/15   Historical Provider, MD  clonazePAM (KLONOPIN) 1 MG tablet Take 1 mg by mouth daily as needed.     Historical Provider, MD  fexofenadine (ALLEGRA) 180 MG  tablet Take 180 mg by mouth daily as needed.  08/22/10   Historical Provider, MD  metaxalone (SKELAXIN) 800 MG tablet Take 1 tablet (800 mg total) by mouth 3 (three) times daily. Patient not taking: Reported on 05/03/2015 02/22/15   Barbaraann Barthel, NP  metoprolol tartrate (LOPRESSOR) 25 MG tablet Take 1 tablet (25 mg total) by mouth 2 (two) times daily. Patient taking differently: Take 50 mg by mouth 2 (two) times daily.  06/19/11   Eustaquio Boyden, MD  Multiple Vitamin (MULTIVITAMIN) tablet Take 1 tablet by mouth daily.      Historical Provider, MD  naproxen (NAPROSYN) 250 MG tablet Take 1 tablet (250 mg total) by mouth 2 (two) times daily with a meal. 02/22/15   Barbaraann Barthel, NP  phentermine 30 MG capsule Take by mouth.    Historical Provider, MD  predniSONE (DELTASONE) 50 MG tablet Take one tab po if headache not responsive to toradol today Patient not taking: Reported on 05/03/2015 02/22/15   Barbaraann Barthel, NP  sertraline (ZOLOFT) 100 MG tablet Take 200 mg by mouth daily.     Historical Provider, MD    Allergies as of 05/03/2015 - Review Complete 05/03/2015  Allergen Reaction Noted  . Warfarin sodium  02/06/2011    Family  History  Problem Relation Age of Onset  . Hypertension Mother   . Diabetes Father   . Hypertension Father   . Hyperlipidemia Father   . Heart disease Maternal Grandmother   . Hypertension Maternal Grandmother   . Heart disease Maternal Grandfather   . Lung cancer Maternal Grandfather   . Alcohol abuse Maternal Grandfather   . Arthritis Maternal Grandfather   . Heart disease Paternal Grandmother   . Hypertension Paternal Grandmother   . Breast cancer Paternal Grandmother   . Heart disease Paternal Grandfather   . Coronary artery disease Paternal Grandfather 53  . Alcohol abuse Paternal Grandfather   . Arthritis Paternal Grandfather   . Stroke Neg Hx   . Colitis Father     Social History   Social History  . Marital Status: Married    Spouse Name:  N/A  . Number of Children: 0  . Years of Education: HS   Occupational History  . [employer] [place]     pharmacy tech   Social History Main Topics  . Smoking status: Never Smoker   . Smokeless tobacco: Never Used  . Alcohol Use: Yes     Comment: Rare  . Drug Use: No  . Sexual Activity: Not on file   Other Topics Concern  . Not on file   Social History Narrative   Caffeine: 1 cup/day   Lives at home with dad, mom, no inside pets   Fish farm manager at [employer]    Review of Systems: See HPI, otherwise negative ROS  Physical Exam: Ht  (1.778 m)  Wt 294 lb (133.358 kg)  BMI 42.18 kg/m2  LMP 04/24/2015 (Approximate) General:   Alert,  pleasant and cooperative in NAD Head:  Normocephalic and atraumatic. Neck:  Supple; no masses or thyromegaly. Lungs:  Clear throughout to auscultation.    Heart:  Regular rate and rhythm. Abdomen:  Soft, nontender and nondistended. Normal bowel sounds, without guarding, and without rebound.   Neurologic:  Alert and  oriented x4;  grossly normal neurologically.  Impression/Plan: Debra Terry is here for an colonoscopy to be performed for diarrhea  Risks, benefits, limitations, and alternatives regarding  colonoscopy have been reviewed with the patient.  Questions have been answered.  All parties agreeable.   Darlina Rumpf, MD  05/16/2015, 8:10 AM

## 2015-05-17 ENCOUNTER — Encounter: Payer: Self-pay | Admitting: Gastroenterology

## 2015-05-22 ENCOUNTER — Telehealth: Payer: Self-pay

## 2015-05-22 NOTE — Telephone Encounter (Signed)
LVM for pt to return my call.

## 2015-05-22 NOTE — Telephone Encounter (Signed)
-----   Message from Darren Wohl, MD sent at 05/21/2015 11:28 AM EDT ----- The patient know that the biopsies of her small bowel and colon were normal and did not show any cause for her diarrhea. 

## 2015-05-24 NOTE — Telephone Encounter (Deleted)
-----   Message from Midge Minium, MD sent at 05/21/2015 11:28 AM EDT ----- The patient know that the biopsies of her small bowel and colon were normal and did not show any cause for her diarrhea.

## 2015-05-24 NOTE — Telephone Encounter (Signed)
LVM for pt to return my call.

## 2015-05-30 NOTE — Telephone Encounter (Signed)
Mailed pt letter informing her of her results due to pt not returning my calls.

## 2015-06-05 ENCOUNTER — Telehealth: Payer: Self-pay | Admitting: Gastroenterology

## 2015-06-05 NOTE — Telephone Encounter (Signed)
Patient called and said she knows that her results were fine but now she is having stomach pain and joint pain. She is taking Aleve but it isn't helping. Where does she go from here?

## 2015-06-06 NOTE — Telephone Encounter (Signed)
LVM for pt to return my call.

## 2015-06-18 NOTE — Telephone Encounter (Signed)
LVM for pt to return my call again to discuss scheduling a follow up if still having problems.

## 2015-09-18 ENCOUNTER — Ambulatory Visit: Payer: Self-pay | Admitting: Physician Assistant

## 2015-09-20 ENCOUNTER — Ambulatory Visit
Admission: EM | Admit: 2015-09-20 | Discharge: 2015-09-20 | Payer: Self-pay | Attending: Family Medicine | Admitting: Family Medicine

## 2015-09-20 DIAGNOSIS — R0602 Shortness of breath: Secondary | ICD-10-CM

## 2015-09-20 DIAGNOSIS — J069 Acute upper respiratory infection, unspecified: Secondary | ICD-10-CM

## 2015-09-20 LAB — RAPID INFLUENZA A&B ANTIGENS (ARMC ONLY)
INFLUENZA A (ARMC): NOT DETECTED
INFLUENZA B (ARMC): NOT DETECTED

## 2015-09-20 MED ORDER — SODIUM CHLORIDE 0.9 % IV BOLUS (SEPSIS): 1000.0000 mL | Freq: Once | INTRAVENOUS | Status: DC

## 2015-09-20 NOTE — Discharge Instructions (Signed)
Go directly to Emergency room as discussed.   Follow up closely with your primary care physician this week. Return to Urgent care for new or worsening concerns.   Influenza, Adult Influenza ("the flu") is a viral infection of the respiratory tract. It occurs more often in winter months because people spend more time in close contact with one another. Influenza can make you feel very sick. Influenza easily spreads from person to person (contagious). CAUSES  Influenza is caused by a virus that infects the respiratory tract. You can catch the virus by breathing in droplets from an infected person's cough or sneeze. You can also catch the virus by touching something that was recently contaminated with the virus and then touching your mouth, nose, or eyes. RISKS AND COMPLICATIONS You may be at risk for a more severe case of influenza if you smoke cigarettes, have diabetes, have chronic heart disease (such as heart failure) or lung disease (such as asthma), or if you have a weakened immune system. Elderly people and pregnant women are also at risk for more serious infections. The most common problem of influenza is a lung infection (pneumonia). Sometimes, this problem can require emergency medical care and may be life threatening. SIGNS AND SYMPTOMS  Symptoms typically last 4 to 10 days and may include:  Fever.  Chills.  Headache, body aches, and muscle aches.  Sore throat.  Chest discomfort and cough.  Poor appetite.  Weakness or feeling tired.  Dizziness.  Nausea or vomiting. DIAGNOSIS  Diagnosis of influenza is often made based on your history and a physical exam. A nose or throat swab test can be done to confirm the diagnosis. TREATMENT  In mild cases, influenza goes away on its own. Treatment is directed at relieving symptoms. For more severe cases, your health care provider may prescribe antiviral medicines to shorten the sickness. Antibiotic medicines are not effective because the  infection is caused by a virus, not by bacteria. HOME CARE INSTRUCTIONS  Take medicines only as directed by your health care provider.  Use a cool mist humidifier to make breathing easier.  Get plenty of rest until your temperature returns to normal. This usually takes 3 to 4 days.  Drink enough fluid to keep your urine clear or pale yellow.  Cover yourmouth and nosewhen coughing or sneezing,and wash your handswellto prevent thevirusfrom spreading.  Stay homefromwork orschool untilthe fever is gonefor at least 22full day. PREVENTION  An annual influenza vaccination (flu shot) is the best way to avoid getting influenza. An annual flu shot is now routinely recommended for all adults in the [place] SEEK MEDICAL CARE IF:  You experiencechest pain, yourcough worsens,or you producemore mucus.  Youhave nausea,vomiting, ordiarrhea.  Your fever returns or gets worse. SEEK IMMEDIATE MEDICAL CARE IF:  You havetrouble breathing, you become short of breath,or your skin ornails becomebluish.  You have severe painor stiffnessin the neck.  You develop a sudden headache, or pain in the face or ear.  You have nausea or vomiting that you cannot control. MAKE SURE YOU:   Understand these instructions.  Will watch your condition.  Will get help right away if you are not doing well or get worse.   This information is not intended to replace advice given to you by your health care provider. Make sure you discuss any questions you have with your health care provider.   Document Released: 08/15/2000 Document Revised: 09/08/2014 Document Reviewed: 11/17/2011 Elsevier Interactive Patient Education [employer]

## 2015-09-20 NOTE — ED Notes (Signed)
Daughter + Dx of Flu 3 days ago. Sudden onset of aches, pains, chills and cough.

## 2015-09-20 NOTE — ED Provider Notes (Signed)
[facility]  ____________________________________________  Time seen: Approximately 7:37 PM  I have reviewed the triage vital signs and the nursing notes.   HISTORY  Chief Complaint Influenza   HPI Debra Terry is a 30 y.o. female  presents with husband at bedside for the complaints of 2 days of sudden onset runny nose, congestion, cough, warm and cold chills, body aches. Patient reports that just prior to her symptom onset her daughter was tested positive for influenza A strain. Patient reports that she has been with her daughter every day and states that her daughter has coughed and sneezed directly in her face. Patient reports low-grade fevers at home, reports did not measure temperature.  Patient also reports that she has been drinking a lot of fluids today trying to stay hydrated, but reports that she feels like she has a dry mouth and has only urinated twice all day today. Denies dysuria, vaginal pain, vaginal discharge patient reports she is currently on her menstrual cycle. Denies chance of pregnancy..   Patient reports she has continued to eat and drink well. Denies nausea, vomiting, diarrhea, dizziness, rash or injury. Patient reports that she does occasionally have some shortness of breath with cough and states occasional wheeze. Patient denies shortness of breath in absence of cough. Denies current shortness of breath. Denies chest pain. Denies recent immobilization, long trips, surgeries, leg or extremity swelling or redness.  PCP: Dr. Arlana Pouch   Past Medical History  Diagnosis Date  . Depression     lost child 08/2011  . History of syncope     neurocardiogenic per pt  . Generalized headaches     frequent  . Seasonal allergies   . Migraine headache     h/o admission at [facility] for this  . 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 Hx: UTI (urinary tract infection)   . Lupus  anticoagulant positive   . H/O: menorrhagia     to start depo for this  . Obesity     lost >100 lbs, now gained back some, previously ran 2 mi in am 1 in pm  . Allergic rhinitis   . Complication of anesthesia     woke during T&A  . Motion sickness     all moving vehicles  . Wears contact lenses     Patient Active Problem List   Diagnosis Date Noted  . Diarrhea   . Hematochezia   . Cephalalgia 04/20/2014  . Blurred vision 04/20/2014  . Bleeding from the nose 04/20/2014  . Healthcare maintenance 05/01/2011  . Hand pain 05/01/2011  . Allergic rhinitis   . Back pain 02/07/2011  . Arthralgia of multiple joints 01/23/2011  . Depression   . Migraine headache   . History of pulmonary embolism   . Lupus anticoagulant positive     Past Surgical History  Procedure Laterality Date  . Exploratory laparotomy  2006    Excessive vaginal bleeding  . Tonsillectomy and adenoidectomy  1998  . Eye surgery  1999  . Cesarean section    . Colonoscopy with propofol N/A 05/16/2015    Procedure: COLONOSCOPY WITH PROPOFOL;  Surgeon: Midge Minium, MD;  Location: [facility];  Service: Endoscopy;  Laterality: N/A;    Current Outpatient Rx  Name  Route  Sig  Dispense  Refill  . metoprolol tartrate (LOPRESSOR) 25 MG tablet   Oral  Take 1 tablet (25 mg total) by mouth 2 (two) times daily. Patient taking differently: Take 50 mg by mouth 2 (two) times daily.    60 tablet   3   . Multiple Vitamin (MULTIVITAMIN) tablet   Oral   Take 1 tablet by mouth daily.           . sertraline (ZOLOFT) 100 MG tablet   Oral   Take 200 mg by mouth daily.          .           .           .           .           .           .           .           .           .           .             Allergies Warfarin sodium  Family History  Problem Relation Age of Onset  . Hypertension Mother   . Diabetes Father   . Hypertension Father   . Hyperlipidemia Father   . Colitis Father   . Heart  disease Maternal Grandmother   . Hypertension Maternal Grandmother   . Heart disease Maternal Grandfather   . Lung cancer Maternal Grandfather   . Alcohol abuse Maternal Grandfather   . Arthritis Maternal Grandfather   . Heart disease Paternal Grandmother   . Hypertension Paternal Grandmother   . Breast cancer Paternal Grandmother   . Heart disease Paternal Grandfather   . Coronary artery disease Paternal Grandfather 47  . Alcohol abuse Paternal Grandfather   . Arthritis Paternal Grandfather   . Stroke Neg Hx     Social History Social History  Substance Use Topics  . Smoking status: Never Smoker   . Smokeless tobacco: Never Used  . Alcohol Use: Yes     Comment: Rare    Review of Systems Constitutional: Subjective fevers. Eyes: No visual changes. ENT: No sore throat. positive runny nose, nasal congestion and cough.  Cardiovascular: Denies chest pain. Respiratory: Positive intermittent cough. Gastrointestinal: No abdominal pain.  No nausea, no vomiting.  No diarrhea.  No constipation. Genitourinary: Negative for dysuria. Musculoskeletal: Negative for back pain. Skin: Negative for rash. Neurological: Negative for headaches, focal weakness or numbness.  10-point ROS otherwise negative.  ____________________________________________   PHYSICAL EXAM:  VITAL SIGNS: ED Triage Vitals  Enc Vitals Group     BP 09/20/15 1841 141/94 mmHg     Pulse Rate 09/20/15 1841 121     Resp 09/20/15 1841 18     Temp 09/20/15 1841 98.4 F (36.9 C)     Temp Source 09/20/15 1841 Tympanic     SpO2 09/20/15 1841 100 %     Weight 09/20/15 1841 295 lb (133.811 kg)     Height 09/20/15 1841  (1.803 m)     Head Cir --      Peak Flow --      Pain Score 09/20/15 1847 9     Pain Loc --      Pain Edu? --      Excl. in GC? --     Constitutional: Alert and oriented. Well appearing and in no acute distress. Eyes: Conjunctivae are normal.  PERRL. EOMI. Head: Atraumatic. No sinus tenderness  to palpation. No swelling. No erythema.  Ears: no erythema, normal TMs bilaterally.   Nose: Nasal congestion with bilateral nasal turbinate erythema, clear rhinorrhea.  Mouth/Throat: Mucous membranes are moist.  Oropharynx non-erythematous. No tonsillar swelling or exudate. Neck: No stridor.  No cervical spine tenderness to palpation. Hematological/Lymphatic/Immunilogical: No cervical lymphadenopathy. Cardiovascular: Normal rate, regular rhythm. Grossly normal heart sounds.  Good peripheral circulation. Respiratory: Normal respiratory effort.  No retractions. Lungs CTAB. no wheezes, rales or rhonchi. Occasional dry intermittent cough.  Gastrointestinal: Soft and nontender. Obese abdomen. Normal Bowel sounds.  No CVA tenderness. Musculoskeletal: No lower or upper extremity tenderness nor edema.  No calf tenderness bilaterally. Bilateral pedal pulses equal and easily palpated. No cervical, thoracic or lumbar tenderness to palpation. Neurologic:  Normal speech and language. No gross focal neurologic deficits are appreciated. No gait instability. Skin:  Skin is warm, dry and intact. No rash noted. Psychiatric: Mood and affect are normal. Speech and behavior are normal.  ____________________________________________   LABS (all labs ordered are listed, but only abnormal results are displayed)  Labs Reviewed  RAPID INFLUENZA A&B ANTIGENS ([facility] ONLY)    INITIAL IMPRESSION / ASSESSMENT AND PLAN / ED COURSE  Pertinent labs & imaging results that were available during my care of the patient were reviewed by me and considered in my medical decision making (see chart for details).  Well-appearing patient. No acute distress. Presents with husband at bedside for the complaints of 2 days of sudden onset runny nose, nasal congestion, cough, body aches. Reports prior to symptom onset, patients  daughter was diagnosed with influenza a by testing positive at her doctor's. Abdomen soft and nontender. Lungs  clear throughout. Nasal congestion with clear rhinorrhea. Influenza test negative in urgent care however still suspect influenza as positive exposure. Patient also noted to be tachycardic at 121, patient reports that she's been drinking fluids well, will give 1 L normal saline IV and will reevaluate. Denies dizziness and denies dizziness with position changes.  RNs unable to obtain IV line post x 4 attempts. Patient continues to remain tachycardic with some reports of shortness of breath. Patient also reports her mouth feels dry. Concern with continued tachycardia for dehydration. Also discussed in detail with patient and husband as patient with some shortness of breath and history of PE 10 years ago, consideration in regards to pulmonary embolus although think that this is less likely and recommend fluids and reevaluation prior to ordering d-dimer as it may be falsely positive. Patient and spouse states they do not want lab work and testing done if not needed as patient currently has a gap in insurance. As patient remains tachycardic, unable to obtain IV line and with continued symptoms discussed with patient and spouse and recommend further evaluation and monitoring and emergency room of their choice. Patient and spouse verbalized understanding and agreed to this plan. Patient alert and oriented with decisional capacity and states that her husband will drive her, refused EMS transport. Patient and spouse report they will go to [facility]. Report called to [facility] RN charge nurse.   Discussed follow up with Primary care physician this week. Discussed follow up and return parameters including no resolution or any worsening concerns. Patient verbalized understanding and agreed to plan.   ____________________________________________   FINAL CLINICAL IMPRESSION(S) / ED DIAGNOSES  Final diagnoses:  Shortness of breath  Viral upper respiratory infection      Note: This dictation was prepared with  Dragon dictation  along with smaller phrase technology. Any transcriptional errors that result from this process are unintentional.    Renford Dills, NP 09/20/15 2110

## 2015-10-26 ENCOUNTER — Ambulatory Visit: Payer: Self-pay | Admitting: Physician Assistant

## 2015-10-26 ENCOUNTER — Encounter: Payer: Self-pay | Admitting: Physician Assistant

## 2015-10-26 VITALS — BP 120/70 | HR 109 | Temp 97.8°F

## 2015-10-26 DIAGNOSIS — J069 Acute upper respiratory infection, unspecified: Secondary | ICD-10-CM

## 2015-10-26 MED ORDER — AMOXICILLIN 875 MG PO TABS: 875.0000 mg | ORAL_TABLET | Freq: Two times a day (BID) | ORAL | Status: DC

## 2015-10-26 MED ORDER — FLUCONAZOLE 150 MG PO TABS: ORAL_TABLET | ORAL | Status: DC

## 2015-10-26 NOTE — Progress Notes (Signed)
S: had flu 2 weeks ago, now has head congestion and ear pain, no fever/chills, some mild cough, no cp/sob, kids are sick with ear infections and sore throats, entire family had flu A  O: vitals wnl, tms dull and pink, nasal mucosa red, throat wnl, neck supple no lymph, lungs c t a, cv rrr  A: acute uri  P: amoxil, dilfucan

## 2015-12-27 ENCOUNTER — Other Ambulatory Visit: Payer: Self-pay | Admitting: Physician Assistant

## 2016-02-01 ENCOUNTER — Ambulatory Visit: Payer: Self-pay | Admitting: Physician Assistant

## 2016-08-28 ENCOUNTER — Ambulatory Visit (HOSPITAL_BASED_OUTPATIENT_CLINIC_OR_DEPARTMENT_OTHER)
Admission: RE | Admit: 2016-08-28 | Discharge: 2016-08-28 | Disposition: A | Discharge status: Home / Self Care | Payer: Self-pay | Source: Ambulatory Visit | Attending: Maternal and Fetal Medicine | Admitting: Maternal and Fetal Medicine

## 2016-08-28 ENCOUNTER — Other Ambulatory Visit: Payer: Self-pay | Admitting: *Deleted

## 2016-08-28 ENCOUNTER — Ambulatory Visit
Admission: RE | Admit: 2016-08-28 | Discharge: 2016-08-28 | Disposition: A | Discharge status: Home / Self Care | Payer: Self-pay | Source: Ambulatory Visit | Attending: Maternal and Fetal Medicine | Admitting: Maternal and Fetal Medicine

## 2016-08-28 ENCOUNTER — Encounter: Payer: Self-pay | Admitting: *Deleted

## 2016-08-28 ENCOUNTER — Other Ambulatory Visit: Payer: Self-pay

## 2016-08-28 VITALS — BP 142/75 | HR 80 | Temp 98.2°F | Wt 315.0 lb

## 2016-08-28 DIAGNOSIS — F329 Major depressive disorder, single episode, unspecified: Secondary | ICD-10-CM | POA: Insufficient documentation

## 2016-08-28 DIAGNOSIS — O99341 Other mental disorders complicating pregnancy, first trimester: Secondary | ICD-10-CM | POA: Insufficient documentation

## 2016-08-28 DIAGNOSIS — R000 Tachycardia, unspecified: Secondary | ICD-10-CM

## 2016-08-28 DIAGNOSIS — O09291 Supervision of pregnancy with other poor reproductive or obstetric history, first trimester: Secondary | ICD-10-CM | POA: Insufficient documentation

## 2016-08-28 DIAGNOSIS — Z86711 Personal history of pulmonary embolism: Secondary | ICD-10-CM | POA: Insufficient documentation

## 2016-08-28 DIAGNOSIS — Z3A12 12 weeks gestation of pregnancy: Secondary | ICD-10-CM | POA: Insufficient documentation

## 2016-08-28 DIAGNOSIS — R410 Disorientation, unspecified: Secondary | ICD-10-CM | POA: Insufficient documentation

## 2016-08-28 DIAGNOSIS — G90A Postural orthostatic tachycardia syndrome (POTS): Secondary | ICD-10-CM

## 2016-08-28 DIAGNOSIS — E669 Obesity, unspecified: Secondary | ICD-10-CM | POA: Insufficient documentation

## 2016-08-28 DIAGNOSIS — I951 Orthostatic hypotension: Secondary | ICD-10-CM

## 2016-08-28 DIAGNOSIS — O99411 Diseases of the circulatory system complicating pregnancy, first trimester: Secondary | ICD-10-CM | POA: Insufficient documentation

## 2016-08-28 DIAGNOSIS — O99211 Obesity complicating pregnancy, first trimester: Secondary | ICD-10-CM | POA: Insufficient documentation

## 2016-08-28 LAB — ANTITHROMBIN III: AntiThromb III Func: 119 % (ref 75–120)

## 2016-08-28 NOTE — Progress Notes (Addendum)
Referring Provider:  Albina Billet Length of Consultation: 45 minutes  Ms. Crotteau was referred to [place] for genetic counseling because of a previous pregnancy with Turner syndrome (45,X).  She also met with Dr. Ricardo Jericho for an MFM consultation regarding her history of syncope and a PE at age 30.  See that note for details, testing and recommendations. This note summarizes the information we discussed regarding chromosome conditions.   We obtained a detailed family history and pregnancy history.  Ms. 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  Following confirmation of the diagnosis, she elected not to continue the pregnancy.   She and her current husband then had a pregnancy which resulted in the birth of a healthy daughter who is now 57 years old.  During that pregnancy, the patient had CVS at [facility] and recalls that she got a call saying the pregnancy was affected with Turner syndrome and then a week later getting another call that the chromosomal results were normal.  I offered to review those records for clarification, but she and her husband declined this offer.  Their daughter is in good health, with normal growth and development at age 47.  The father of the baby, Edison Nasuti, also has a 28 year old daughter who is in good health with the exception of seizures that are thought to be due to a lesion on her brain.  The doctors are following that lesion, which has not changed in several years.  The patient reported one paternal first cousin with developmental delay and a possible chromosome condition, but was not aware of the name for her condition.  We reviewed that there may be many reasons for developmental differences and that without additional medical information, we cannot comment on the chance for other family members to have a similar condition.  She  and Edison Nasuti both reported numerous family members with heart disease, diabetes and hypertension.  We reviewed that these conditions are often the result of a combination of genetic and lifestyle factors which may increase the chance for their child to develop a similar condition.  The remainder of the family history is unremarkable for birth defects, mental retardation, recurrent pregnancy loss or known chromosome abnormalities.  Ms. Dishner reported no complications in this pregnancy.  She reported taking medications for syncope (metoprolol and sertraline). Neither of these medications is known to increase the risk for birth defects. She also has a history of possible PE at age 27 years for which she was treated with lovenox in her last pregnancy.  See the MFM consultation note from today for details and a plan of care/testing.  With regard to the pregnancy history, we reviewed that chromosomes are the inherited structures that contain our instructions for development (genes). Each cell of our body normally has 46 chromosomes, matched up into 23 pairs. The last pair determines our gender and are called the sex chromosomes. A female has an X and a Y chromosome, while a female typically has two X chromosomes. Rarely, when a mother's egg and father's sperm unite, an extra or missing chromosome can be passed on to the baby by mistake. Changes in the number or the structure of the chromosomes may result in a child with some degree of mental retardation and physical problems.   Turner syndrome is caused when there is a missing sex chromosome, resulting in 31 total  chromosomes with one X chromosome (45,X).  This condition typically occurs by chance, and the missing chromosome most often is the result of abnormal cell division in the sperm, though it can be the result of abnormal division in the egg.  Individuals with Turner syndrome are at increased risk for birth defects including cystic hygroma and structural heart  defects and often pass away prior to birth.  Children born with Turner syndrome may have mild developmental differences as well as fertility concerns. Because this condition most often happen by chance, the risk for a chromosome condition in any future pregnancy is expected to be very low.  However, we would still offer chromosome testing in this and any future pregnancy.    We discussed the following prenatal screening and testing options for this pregnancy:  Cell free fetal DNA testing from maternal blood to determine whether or not the baby may have either Down syndrome, trisomy 74, or trisomy 54. This test utilizes a maternal blood sample and DNA sequencing technology to isolate circulating cell free fetal DNA from maternal plasma. The fetal DNA can then be analyzed for DNA sequences that are derived from the three most common chromosomes involved in aneuploidy, chromosomes 13, 18, and 21 as well as the sex chromosomes. If the overall amount of DNA is greater than the expected level for any of these chromosomes, aneuploidy is suspected. While we do not consider it a replacement for invasive testing and karyotype analysis, a negative result from this testing would be reassuring, though not a guarantee of a normal chromosome complement for the baby. An abnormal result is certainly suggestive of an abnormal chromosome complement, though we would still recommend CVS or amniocentesis to confirm any findings from this testing.  First trimester screening, which can include nuchal translucency ultrasound screen and/or first trimester maternal serum marker screening. The nuchal translucency has approximately an 80% detection rate for Down syndrome and can be positive for other chromosome abnormalities as well as heart defects. When combined with a maternal serum marker screening, the detection rate is up to 90% for Down syndrome and up to 97% for trisomy 18. This testing cannot test specifically for sex chromosome  conditions.  The chorionic villus sampling procedure is available for first trimester chromosome analysis. This involves the withdrawal of a small amount of chorionic villi (tissue from the developing placenta). Risk of pregnancy loss is estimated to be approximately 1 in 200 to 1 in 100 (0.5 to 1%). There is approximately a 1% (1 in 100) chance that the CVS chromosome results will be unclear. Chorionic villi cannot be tested for neural tube defects.   Maternal serum marker screening, a blood test that measures pregnancy proteins, can provide risk assessments for Down syndrome, trisomy 18, and open neural tube defects (spina bifida, anencephaly). Because it does not directly examine the fetus, it cannot positively diagnose or rule out these problems.  Targeted ultrasound uses high frequency sound waves to create an image of the developing fetus. An ultrasound is often recommended as a routine means of evaluating the pregnancy. It is also used to screen for fetal anatomy problems (for example, a heart defect) that might be suggestive of a chromosomal or other abnormality.   Amniocentesis involves the removal of a small amount of amniotic fluid from the sac surrounding the fetus with the use of a thin needle inserted through the maternal abdomen and uterus. Ultrasound guidance is used throughout the procedure. Fetal cells from amniotic fluid are directly evaluated and >  99.5% of chromosome problems and > 98% of open neural tube defects can be detected. This procedure is generally performed after the 15th week of pregnancy. The main risks to this procedure include complications leading to miscarriage in less than 1 in 200 cases (0.5%).  We also offered the option of CF and SMA carrier screening, which Ms. Vent declined.  After consideration of the options, Ms. Graber elected to proceed with cell free DNA testing and to schedule a detailed anatomy ultrasound at [place]. The  patient stated that no ultrasound had been done prior to today's visit, but that one is scheduled at [facility] on 08/29/2016.  We may be reached at [phone].   Wilburt Finlay, MS, CGC  I was immediately available and supervising. Coralie Keens, MD [facility]

## 2016-08-28 NOTE — Progress Notes (Signed)
[facility] Consultation   Chief Complaint: Consult for POTS and history of PE  HPI: Debra Terry is a 30 y.o. G3P1011 at 3222w2d who presents in consultation from  for diagnosis of POTS and possible personal history of PE. The patient reports that she was [age] and presented to her internal medicine doctor, who performed tests in the office (she thinks an XRAY) and diagnosed a pulmonary embolus. She reports that she was hospitalized and was treated with heparin and lovenox, transitioned to coumadin after having a bleeding gastric ulcer and then completed her therapy with lovenox. She reports that she has since seen a physician who looked at her records and did not feel that she had a pulmonary embolus. She also reports that at the time she had a positive lupus anticoagulant but it was during the acute clot/treatment time and has not been re-tested. She took lovenox with her most recent pregnancy.  She also has POTS diagnosed by tilt table test, doing well on oral medication.  Past Medical History: Patient  has a past medical history of Allergic rhinitis; Complication of anesthesia; Depression; Generalized headaches; H/O: menorrhagia; History of pulmonary embolism (2010); History of syncope; UTI (urinary tract infection); Lupus anticoagulant positive; Migraine headache; Motion sickness; Obesity; Seasonal allergies; and Wears contact lenses.  Past Surgical History: She  has a past surgical history that includes Exploratory laparotomy (2006); Tonsillectomy and adenoidectomy (1998); Eye surgery (1999); Cesarean section; and Colonoscopy with propofol (N/A, 05/16/2015).  Obstetric History:  OB History    Gravida Para Term Preterm AB Living   3 1 1  0 1 1   SAB TAB Ectopic Multiple Live Births   0 1 0 0 1    First pregnancy TAB for Turner's Second pregnancy full term CS, alive and well. Had a CVS and some confusion about results being told Turner's again but child is alive and  well.  Medications: She takes metoprolol and sertraline Allergies: Patient is allergic to warfarin sodium.  (bleeding ulcer) Social History: Patient  reports that she has never smoked. She has never used smokeless tobacco. She reports that she drinks alcohol. She reports that she does not use drugs.  Family History: family history includes Alcohol abuse in her maternal grandfather and paternal grandfather; Arthritis in her maternal grandfather and paternal grandfather; Breast cancer in her paternal grandmother; Colitis in her father; Coronary artery disease (age of onset: 8260) in her paternal grandfather; Diabetes in her father; Heart disease in her maternal grandfather, maternal grandmother, paternal grandfather, and paternal grandmother; Hyperlipidemia in her father; Hypertension in her father, maternal grandmother, mother, and paternal grandmother; Lung cancer in her maternal grandfather.  Review of Systems A full 12 point review of systems was negative or as noted in the History of Present Illness.   Asessement: 1. History of pulmonary embolism   2. POTS (postural orthostatic tachycardia syndrome)    1. H/o PE We discussed the option of just starting lovenox again with this pregnancy vs obtaining testing and records to determine whether diagnosis was accurate and whether she needs therapy during the pregnancy. She prefers to have a full evaluation so that she can avoid taking lovenox throughout the pregnancy if it is not necessary. Today I have ordered a request for records from the original diagnosis (Dr Hyacinth MeekerMiller in [facility] approx 10 years ago). I have also ordered an inherited and an acquired thrombophilia evaluation so that if the diagnosis is unclear based on records, a recommendation will  be able to be made if there is a high risk thrombophilia present. I recommended starting bASA today and following up her with us at MFM in 2 weeks to review records and the test  results.  2. POTS We discussed caution with positioning and avoiding standing quickly during pregnancy as the risk for vagal events increases. She should continue her medications as currently prescribed and should maintain excellent hydration throughout the pregnancy.  Total time spent with the patient was 40 minutes with greater than 50% spent in counseling and coordination of care. We appreciate this interesting consult and will be happy to be involved in the ongoing care of [name] in anyway her obstetricians desire.  Artemio AlyBrenna Analissa Bayless, MD Maternal-Fetal Medicine [facility]

## 2016-08-29 LAB — CARDIOLIPIN ANTIBODIES, IGM+IGG
Anticardiolipin IgG: <9 GPL U/mL (ref 0–14)
Anticardiolipin IgM: <9 MPL U/mL (ref 0–12)

## 2016-08-29 LAB — PROTEIN S ACTIVITY: Protein S Activity: 80 % (ref 63–140)

## 2016-08-29 LAB — LUPUS ANTICOAGULANT PANEL
DRVVT: 33.7 s (ref 0.0–47.0)
PTT Lupus Anticoagulant: 33.8 s (ref 0.0–51.9)

## 2016-08-29 LAB — PROTEIN S, TOTAL: PROTEIN S AG TOTAL: 90 % (ref 60–150)

## 2016-09-04 DIAGNOSIS — O26891 Other specified pregnancy related conditions, first trimester: Secondary | ICD-10-CM

## 2016-09-04 DIAGNOSIS — Z6791 Unspecified blood type, Rh negative: Secondary | ICD-10-CM | POA: Insufficient documentation

## 2016-09-05 ENCOUNTER — Telehealth: Payer: Self-pay | Admitting: Obstetrics and Gynecology

## 2016-09-05 LAB — INFORMASEQ(SM) WITH XY ANALYSIS
FETAL NUMBER: 2
Fetal Fraction (%):: 6.2
GESTATIONAL AGE AT COLLECTION: 8.7 wk
PDF: 0
WEIGHT: 315 [lb_av]

## 2016-09-05 NOTE — Telephone Encounter (Signed)
I spoke with Debra Terry yesterday regarding the normal results of her InformaSeq testing. At that time, she said that [facility]2016 revealed a twin gestation.  Upon review of the ultrasound report, the gestational age was 8 weeks, 6 days on 08/29/2016. Therefore, she was only 8 weeks, 5 days at the time her InformaSeq was drawn, which is too early for interpretation. We offered repeat InformaSeq testing at the appropriate time, after [redacted] weeks gestation.  She already has a visit in our clinic on Thursday, 09/11/2016 and would like to have it repeated at that time.  We may be reached at [phone].  Cherly Andersoneborah F. Katalia Choma, MS, CGC

## 2016-09-08 LAB — INFORMASEQ(SM) WITH XY ANALYSIS
Fetal Fraction (%):: 6.2
Fetal Number: 2
Gestational Age at Collection: 8.7 weeks
WEIGHT: 315 [lb_av]

## 2016-09-08 LAB — FACTOR 5 LEIDEN

## 2016-09-11 ENCOUNTER — Ambulatory Visit
Admission: RE | Admit: 2016-09-11 | Discharge: 2016-09-11 | Disposition: A | Discharge status: Home / Self Care | Payer: Self-pay | Source: Ambulatory Visit | Attending: Maternal and Fetal Medicine | Admitting: Maternal and Fetal Medicine

## 2016-09-11 DIAGNOSIS — Z8669 Personal history of other diseases of the nervous system and sense organs: Secondary | ICD-10-CM | POA: Insufficient documentation

## 2016-09-11 DIAGNOSIS — G90A Postural orthostatic tachycardia syndrome (POTS): Secondary | ICD-10-CM

## 2016-09-11 DIAGNOSIS — I951 Orthostatic hypotension: Secondary | ICD-10-CM

## 2016-09-11 DIAGNOSIS — Z86711 Personal history of pulmonary embolism: Secondary | ICD-10-CM

## 2016-09-11 DIAGNOSIS — R000 Tachycardia, unspecified: Secondary | ICD-10-CM

## 2016-09-11 NOTE — Progress Notes (Signed)
[facility] Consultation   Chief Complaint: Follow-up consult to review results of tests and record retrieval  HPI: [name] is a 31 y.o. G3P1011 at [redacted]w[redacted]d by who presents in consultation  for possible history of PE in 2009. I have obtained records from Dr. Rondel Baton office. The patient presented to his office in June, 2009 with unilateral severe chest pain and this was pleuritic in nature. She had an o2 sat of 100% and pulse of 100. EKG was wnl. A chest CT was ordered and negative for PE. She was admitted to the hospital and started on anticoagulation while awaiting results. Her Lupus anticoagulant returned weakly positive (on anti-coagulation) and so she was treated as though she had a PE with coumadin despite the negative CT and no other evidence of PE.  Since last visit, I have received the following results: Lupus anticoagulant negative, Anticardiolipin negative, beta-2 glycoprotein negative, Factor V leiden negative, antithrombin III def negative, Protein C and S def negative. Prothrombin gene is pending.  Past Medical History: Patient  has a past medical history of Allergic rhinitis; Complication of anesthesia; Depression; Generalized headaches; H/O: menorrhagia; History of pulmonary embolism (2010); History of syncope; UTI (urinary tract infection); Lupus anticoagulant positive; Migraine headache; Motion sickness; Obesity; Seasonal allergies; and Wears contact lenses.  Past Surgical History: She  has a past surgical history that includes Exploratory laparotomy (2006); Tonsillectomy and adenoidectomy (1998); Eye surgery (1999); Cesarean section; and Colonoscopy with propofol (N/A, 05/16/2015).  Obstetric History:  OB History    Gravida Para Term Preterm AB Living   3 1 1  0 1 1   SAB TAB Ectopic Multiple Live Births   0 1 0 0 1      Medications: She is taking bASA that I suggested at last visit Allergies: Patient is allergic to warfarin sodium.  Social History:  Patient  reports that she has never smoked. She has never used smokeless tobacco. She reports that she drinks alcohol. She reports that she does not use drugs.  Family History: family history includes Alcohol abuse in her maternal grandfather and paternal grandfather; Arthritis in her maternal grandfather and paternal grandfather; Breast cancer in her paternal grandmother; Colitis in her father; Coronary artery disease (age of onset: 80) in her paternal grandfather; Diabetes in her father; Heart disease in her maternal grandfather, maternal grandmother, paternal grandfather, and paternal grandmother; Hyperlipidemia in her father; Hypertension in her father, maternal grandmother, mother, and paternal grandmother; Lung cancer in her maternal grandfather.  Review of Systems A full 12 point review of systems was negative or as noted in the History of Present Illness.  Physical Exam: BP 123/76   Pulse 72   Temp 98.5 F (36.9 C)   Wt (!) 305 lb (138.3 kg)   LMP 06/03/2016   BMI 42.54 kg/m    Asessement: 1. POTS (postural orthostatic tachycardia syndrome)    2. No evidence of prior PE or of maternal clotting disorder. Plan:  We discussed today that the majority of the evidence does not support that she has ever had a pulmonary embolus and she has no evidence of a maternal clotting disorder or of antiphospholipid antibody syndrome. -I suggest a continued bASA (twins and obesity are risk factors for clotting and for pre-eclampsia) -We discussed risks of clotting related to pregnancy in general and avoidance of long car or airplane rides or bedrest -I would also suggest an H2 blocker given her history of bleeding gastritis while on coumadin  Thank you for  referring your patient to our MFM office for consultation. Please don't hesitate to contact me with any comments or concerns. I spent 30 min in follow-up consultation and record review.  Artemio AlyBrenna Javarri Segal, MD Maternal-Fetal Medicine [facility]

## 2016-09-17 LAB — INFORMASEQ(SM) WITH XY ANALYSIS
FETAL FRACTION (%): 8.2
FETAL NUMBER: 2
Gestational Age at Collection: 14.3 weeks
Sex Chromosome Result: DETECTED
WEIGHT: 305 [lb_av]

## 2016-09-22 ENCOUNTER — Telehealth: Payer: Self-pay | Admitting: Obstetrics and Gynecology

## 2016-09-22 NOTE — Telephone Encounter (Signed)
[name] elected to have her InformaSeq testing repeated on 09/11/2016.   This was performed because the initial testing was drawn too early for interpretation.  The results of her repeat InformaSeq testing (performed at [facility]) yielded NEGATIVE results for this twin gestation.  The patient's specimen showed DNA consistent with two copies of chromosomes 21, 18 and 13.  The sensitivity for trisomy 8221, trisomy 7318 and trisomy 7713 using this testing are reported as 99.1%, 98.3% and 98.1% respectively.  Thus, while the results of this testing are highly accurate, they are not considered diagnostic.  Should more definitive information be desired, the patient may still consider amniocentesis.  As requested to know by the patient, sex chromosome analysis was included for this sample.  Because this is a twin pregnancy, the testing is for the presence or absence of the Y chromosome only and cannot assess the chance for other sex chromosome differences.  Results showed Y chromosome DNA present, which is consistent with at least one of the babies being female.  A maternal serum AFP only should be considered if screening for neural tube defects is desired as well as a detailed ultrasound for fetal anatomy in the second trimester.  Debra Andersoneborah F. Hilmer Aliberti, MS, CGC

## 2017-01-03 ENCOUNTER — Observation Stay
Admission: EM | Admit: 2017-01-03 | Discharge: 2017-01-03 | Disposition: A | Discharge status: Home / Self Care | Payer: Managed Care, Other (non HMO) | Attending: Obstetrics & Gynecology | Admitting: Obstetrics & Gynecology

## 2017-01-03 DIAGNOSIS — Z888 Allergy status to other drugs, medicaments and biological substances status: Secondary | ICD-10-CM | POA: Diagnosis not present

## 2017-01-03 DIAGNOSIS — Z833 Family history of diabetes mellitus: Secondary | ICD-10-CM | POA: Diagnosis not present

## 2017-01-03 DIAGNOSIS — O368130 Decreased fetal movements, third trimester, not applicable or unspecified: Secondary | ICD-10-CM | POA: Diagnosis not present

## 2017-01-03 DIAGNOSIS — Z801 Family history of malignant neoplasm of trachea, bronchus and lung: Secondary | ICD-10-CM | POA: Diagnosis not present

## 2017-01-03 DIAGNOSIS — Z8249 Family history of ischemic heart disease and other diseases of the circulatory system: Secondary | ICD-10-CM | POA: Diagnosis not present

## 2017-01-03 DIAGNOSIS — O30043 Twin pregnancy, dichorionic/diamniotic, third trimester: Secondary | ICD-10-CM | POA: Diagnosis not present

## 2017-01-03 DIAGNOSIS — Z86711 Personal history of pulmonary embolism: Secondary | ICD-10-CM | POA: Insufficient documentation

## 2017-01-03 DIAGNOSIS — O99343 Other mental disorders complicating pregnancy, third trimester: Secondary | ICD-10-CM | POA: Insufficient documentation

## 2017-01-03 DIAGNOSIS — Z8261 Family history of arthritis: Secondary | ICD-10-CM | POA: Diagnosis not present

## 2017-01-03 DIAGNOSIS — Z8379 Family history of other diseases of the digestive system: Secondary | ICD-10-CM | POA: Insufficient documentation

## 2017-01-03 DIAGNOSIS — O99213 Obesity complicating pregnancy, third trimester: Secondary | ICD-10-CM | POA: Insufficient documentation

## 2017-01-03 DIAGNOSIS — Z3490 Encounter for supervision of normal pregnancy, unspecified, unspecified trimester: Secondary | ICD-10-CM

## 2017-01-03 DIAGNOSIS — E669 Obesity, unspecified: Secondary | ICD-10-CM | POA: Diagnosis not present

## 2017-01-03 DIAGNOSIS — Z79899 Other long term (current) drug therapy: Secondary | ICD-10-CM | POA: Diagnosis not present

## 2017-01-03 DIAGNOSIS — Z803 Family history of malignant neoplasm of breast: Secondary | ICD-10-CM | POA: Diagnosis not present

## 2017-01-03 DIAGNOSIS — Z811 Family history of alcohol abuse and dependence: Secondary | ICD-10-CM | POA: Diagnosis not present

## 2017-01-03 DIAGNOSIS — Z7982 Long term (current) use of aspirin: Secondary | ICD-10-CM | POA: Insufficient documentation

## 2017-01-03 DIAGNOSIS — Z3A30 30 weeks gestation of pregnancy: Secondary | ICD-10-CM | POA: Diagnosis present

## 2017-01-03 DIAGNOSIS — F329 Major depressive disorder, single episode, unspecified: Secondary | ICD-10-CM | POA: Diagnosis not present

## 2017-01-03 DIAGNOSIS — Z87440 Personal history of urinary (tract) infections: Secondary | ICD-10-CM | POA: Insufficient documentation

## 2017-01-03 DIAGNOSIS — J309 Allergic rhinitis, unspecified: Secondary | ICD-10-CM | POA: Diagnosis not present

## 2017-01-03 LAB — ROM PLUS (ARMC ONLY): Rom Plus: NEGATIVE

## 2017-01-03 NOTE — Discharge Instructions (Signed)
Discharge instructions given, all questions answered.

## 2017-01-03 NOTE — Procedures (Addendum)
BPP x2, di-di twins with decreased fetal movement x <24hrs  Patient's last menstrual period was 06/03/2016. Estimated Date of Delivery: 03/10/17  Baby A, NST: Baseline:  150 Variability: moderate Accelerations present x >2 Decelerations absent Time 20mins  Bedside ultrasound: Presentation: breech Gross movement x 3+: present Fine movement: present Diaphragm movement x 30sec: present AFI: >2x2 pocket, DVP = 8.8cm  Interpretation: BPP = 10/10, reactive NST, category 1 tracing   Baby B NST: Baseline:  145 Variability: moderate Accelerations present x >2 Decelerations absent Time 20mins  Bedside ultrasound: Presentation: breech Gross movement x 3+: present Fine movement: present Diaphragm movement x 30sec: present AFI: >2x2 pocket, DVP = 9.5cm  Interpretation:  BPP = 10/10, reactive NST, category 1 tracing  Entire procedure(s) done by me at the bedside. ----- Ranae Plumberhelsea Shuaib Corsino, MD Attending Obstetrician and Gynecologist [facility], Department of [facility]GYN [facility]

## 2017-01-03 NOTE — Discharge Summary (Signed)
Debra Terry is a 31 y.o. female. She is at 2065w4d gestation. Patient's last menstrual period was 06/03/2016. Estimated Date of Delivery: 03/10/17  [facility]   Chief complaint: ? LOF and decreased fetal movement of both twins but A>B  Location:  Uterus, vagina Onset/timing: today, most of the day Duration: <24hrs Quality: clear fluid, decreased movement Severity: mild Aggravating or alleviating conditions: nothing Associated signs/symptoms: no CTX, no VB Context:  Debra Terry is a 30yo with history of cesarean, di/di twin boys at 30+weeks who has not felt the babies move as much as they have been, especially baby A.  She also says she has been leaking a small amount of fluid all day, no odor, no blood.  Not sure if it is urine.    Maternal Medical History:   Past Medical History:  Diagnosis Date  . Allergic rhinitis   . Complication of anesthesia    woke during T&A  . Depression    lost child 08/2011  . Generalized headaches    frequent  . H/O: menorrhagia    to start depo for this  . 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 History of syncope    neurocardiogenic per pt  . Hx: UTI (urinary tract infection)   . Lupus anticoagulant positive   . Migraine headache    [facility]  . Motion sickness    all moving vehicles  . Obesity    lost >100 lbs, now gained back some, previously ran 2 mi in am 1 in pm  . Seasonal allergies   . Wears contact lenses     Past Surgical History:  Procedure Laterality Date  . CESAREAN SECTION    . COLONOSCOPY WITH PROPOFOL N/A 05/16/2015   Procedure: COLONOSCOPY WITH PROPOFOL;  Surgeon: Midge Miniumarren Wohl, MD;  [facility];  Service: Endoscopy;  Laterality: N/A;  . EXPLORATORY LAPAROTOMY  2006   Excessive vaginal bleeding  . EYE SURGERY  1999  . TONSILLECTOMY AND ADENOIDECTOMY  1998     Allergies  Allergen Reactions  . Warfarin Sodium     H/o GI bleed.      Prior to Admission medications   Medication Sig Start Date End Date Taking? Authorizing Provider  aspirin EC 81 MG tablet Take 81 mg by mouth daily.    [provider]  folic acid (FOLVITE) 1 MG tablet Take 1 mg by mouth daily.    [provider]  metoprolol tartrate (LOPRESSOR) 25 MG tablet Take 1 tablet (25 mg total) by mouth 2 (two) times daily. Patient taking differently: Take 50 mg by mouth 2 (two) times daily.  06/19/11   Eustaquio BoydenGutierrez, Javier, MD  Prenatal Vit-Fe Fumarate-FA (MULTIVITAMIN-PRENATAL) 27-0.8 MG TABS tablet Take 1 tablet by mouth daily at 12 noon.    [provider]  sertraline (ZOLOFT) 100 MG tablet Take 200 mg by mouth daily.     [provider]     Social History: She  reports that she has never smoked. She has never used smokeless tobacco. She reports that she drinks alcohol. She reports that she does not use drugs.  Family History: family history includes Alcohol abuse in her maternal grandfather and paternal grandfather; Arthritis in her maternal grandfather and paternal grandfather; Breast cancer in her paternal grandmother; Colitis in her father; Coronary artery disease (age of onset:  60) in her paternal grandfather; Diabetes in her father; Heart disease in her maternal grandfather, maternal grandmother, paternal grandfather, and paternal grandmother; Hyperlipidemia in her father; Hypertension in her father, maternal grandmother, mother, and paternal grandmother; Lung cancer in her maternal grandfather.   Review of Systems: A full review of systems was performed and negative except as noted in the HPI.     O:  BP 111/60   Pulse 95   Temp 97.7 F (36.5 C) (Oral)   LMP 06/03/2016  No results found for this or any previous visit (from the past 48 hour(s)).   Constitutional: NAD, AAOx3  HE/ENT: extraocular movements grossly intact, moist mucous  membranes CV: RRR PULM: nl respiratory effort, CTABL     Abd: gravid, non-tender, non-distended, soft      Ext: Non-tender, Nonedmeatous   Psych: mood appropriate, speech normal Pelvic closed long high, nitrazine negative  ROM Plus NEGATIVE  See procedure note:  NST and BPP performed at bedside x 2   A/P: 31 y.o. [redacted]w[redacted]d here for rule out rupture, decreased fetal movement  Labor: not present.   Fetal Wellbeing: Reassuring Cat 1 tracing x2  Reactive NST, BPP 10/10 x2  Movement has not normalized but visualized will on monitor and ultrasound.    D/c home stable, precautions reviewed, follow-up as scheduled.   ----- Ranae Plumber, MD Attending Obstetrician and Gynecologist [facility], Department of OB/GYN [facility]

## 2017-01-15 ENCOUNTER — Observation Stay: Payer: Managed Care, Other (non HMO)

## 2017-01-15 ENCOUNTER — Observation Stay
Admission: EM | Admit: 2017-01-15 | Discharge: 2017-01-15 | Disposition: A | Discharge status: Home / Self Care | Payer: Managed Care, Other (non HMO) | Attending: Obstetrics & Gynecology | Admitting: Obstetrics & Gynecology

## 2017-01-15 DIAGNOSIS — Z86711 Personal history of pulmonary embolism: Secondary | ICD-10-CM | POA: Insufficient documentation

## 2017-01-15 DIAGNOSIS — G90A Postural orthostatic tachycardia syndrome (POTS): Secondary | ICD-10-CM

## 2017-01-15 DIAGNOSIS — R42 Dizziness and giddiness: Secondary | ICD-10-CM | POA: Diagnosis present

## 2017-01-15 DIAGNOSIS — Z7982 Long term (current) use of aspirin: Secondary | ICD-10-CM | POA: Insufficient documentation

## 2017-01-15 DIAGNOSIS — Z3A27 27 weeks gestation of pregnancy: Secondary | ICD-10-CM | POA: Diagnosis not present

## 2017-01-15 DIAGNOSIS — O30042 Twin pregnancy, dichorionic/diamniotic, second trimester: Secondary | ICD-10-CM | POA: Insufficient documentation

## 2017-01-15 DIAGNOSIS — E669 Obesity, unspecified: Secondary | ICD-10-CM | POA: Insufficient documentation

## 2017-01-15 DIAGNOSIS — I951 Orthostatic hypotension: Secondary | ICD-10-CM

## 2017-01-15 DIAGNOSIS — O99342 Other mental disorders complicating pregnancy, second trimester: Secondary | ICD-10-CM | POA: Diagnosis not present

## 2017-01-15 DIAGNOSIS — Z792 Long term (current) use of antibiotics: Secondary | ICD-10-CM | POA: Insufficient documentation

## 2017-01-15 DIAGNOSIS — O99212 Obesity complicating pregnancy, second trimester: Secondary | ICD-10-CM | POA: Insufficient documentation

## 2017-01-15 DIAGNOSIS — Z79899 Other long term (current) drug therapy: Secondary | ICD-10-CM | POA: Diagnosis not present

## 2017-01-15 DIAGNOSIS — O26892 Other specified pregnancy related conditions, second trimester: Principal | ICD-10-CM | POA: Insufficient documentation

## 2017-01-15 DIAGNOSIS — R760 Raised antibody titer: Secondary | ICD-10-CM | POA: Diagnosis not present

## 2017-01-15 DIAGNOSIS — R7981 Abnormal blood-gas level: Secondary | ICD-10-CM

## 2017-01-15 DIAGNOSIS — R000 Tachycardia, unspecified: Secondary | ICD-10-CM

## 2017-01-15 DIAGNOSIS — F329 Major depressive disorder, single episode, unspecified: Secondary | ICD-10-CM | POA: Diagnosis not present

## 2017-01-15 LAB — CBC
HCT: 38.2 % (ref 35.0–47.0)
HEMOGLOBIN: 13.4 g/dL (ref 12.0–16.0)
MCH: 31 pg (ref 26.0–34.0)
MCHC: 35.1 g/dL (ref 32.0–36.0)
MCV: 88.3 fL (ref 80.0–100.0)
PLATELETS: 128 10*3/uL — AB (ref 150–440)
RBC: 4.33 MIL/uL (ref 3.80–5.20)
RDW: 14.5 % (ref 11.5–14.5)
WBC: 9.1 10*3/uL (ref 3.6–11.0)

## 2017-01-15 LAB — COMPREHENSIVE METABOLIC PANEL
ALBUMIN: 3 g/dL — AB (ref 3.5–5.0)
ALK PHOS: 116 U/L (ref 38–126)
ALT: 22 U/L (ref 14–54)
AST: 30 U/L (ref 15–41)
Anion gap: 8 (ref 5–15)
BUN: 7 mg/dL (ref 6–20)
CHLORIDE: 107 mmol/L (ref 101–111)
CO2: 21 mmol/L — AB (ref 22–32)
Calcium: 8.8 mg/dL — ABNORMAL LOW (ref 8.9–10.3)
Creatinine, Ser: 0.41 mg/dL — ABNORMAL LOW (ref 0.44–1.00)
GFR calc non Af Amer: >60 mL/min (ref >60)
GLUCOSE: 86 mg/dL (ref 65–99)
POTASSIUM: 4 mmol/L (ref 3.5–5.1)
SODIUM: 136 mmol/L (ref 135–145)
Total Bilirubin: 1.2 mg/dL (ref 0.3–1.2)
Total Protein: 6.9 g/dL (ref 6.5–8.1)

## 2017-01-15 MED ORDER — LACTATED RINGERS IV SOLN
INTRAVENOUS | Status: DC
  Administered 2017-01-15: 12:00:00 via INTRAVENOUS

## 2017-01-15 NOTE — Progress Notes (Signed)
1920 - Shift report received from Zerita BoersKelly Yates, RN. MD reviewed results, VS stable, O2 Sats wnl. Pt feeling better since arrival. Per MD pt may be discharged home with instructions. PIV d'ced. EFM removed, pt getting oob to change into street clothes for discharge home, no denies dizziness or sob with activity.   [id number] - Discharge teaching and paperwork with multiple deliveries and kick count, encouraged pt to drink plenty fluids, water, rest and avoid strenuous activity, advised pt to call OB provider tomorrow and mention triage visit and discuss next f/u and whether that should be sooner than 2 weeks. 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 Advised pt to discuss work restrictions as pregnancy progresses. Pt verbalized understanding and agreement. VS monitored at discharge, remains stable.

## 2017-01-15 NOTE — Discharge Instructions (Signed)
Continue taking medications as prescribed.

## 2017-01-15 NOTE — OB Triage Note (Signed)
[name] complaint of dizziness, lightheadedness, clammy, sweaty. Denies bleeding and LOF. Reports positive FM (twins). Denies ctx. Denies pain.

## 2017-01-30 NOTE — Discharge Summary (Signed)
Debra Terry is a 31 y.o. female. She is at 33w5dgestation. Patient's last menstrual period was 06/03/2016. Estimated Date of Delivery: 04/04/17 based on ultrasound at 8wks  [facility]   Chief complaint: shortness of breath, feeling dizzy, clammy, lightheaded, sweaty.  Location: chest, body Onset/timing: gradual this morning Duration: 6 hours Quality: dizzy Severity:moderate Aggravating or alleviating conditions: laying down, resting, time have helped Associated signs/symptoms: no CTX, no VB.no LOF Context: Debra Terry pregnant with di-di twins, and has been having occasional SOB at rest.  Today she felt lightheaded and dizzy, and overall not feeling well.     Maternal Medical History:   Past Medical History:  Diagnosis Date  . Allergic rhinitis   . Complication of anesthesia    woke during T&A  . Depression    lost child 08/2011  . Generalized headaches    frequent  . H/O: menorrhagia    to start depo for this  . 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 History of syncope    neurocardiogenic per pt  . Hx: UTI (urinary tract infection)   . Lupus anticoagulant positive   . Migraine headache    [facility]  . Motion sickness    all moving vehicles  . Obesity    lost >100 lbs, now gained back some, previously ran 2 mi in am 1 in pm  . Seasonal allergies   . Wears contact lenses     Past Surgical History:  Procedure Laterality Date  . CESAREAN SECTION    . COLONOSCOPY WITH PROPOFOL N/A 05/16/2015   Procedure: COLONOSCOPY WITH PROPOFOL;  Surgeon: DLucilla Lame MD;  [place] [facility]  Service: Endoscopy;  Laterality: N/A;  . EXPLORATORY LAPAROTOMY  2006   Excessive vaginal bleeding  . EYE SURGERY  1999  . TONSILLECTOMY AND ADENOIDECTOMY  1998    Allergies  Allergen Reactions  . Warfarin Sodium     H/o GI bleed.       Prior to Admission medications   Medication Sig Start Date End Date Taking? Authorizing Provider  aspirin EC 81 MG tablet Take 81 mg by mouth daily.   Yes [provider]  folic acid (FOLVITE) 1 MG tablet Take 1 mg by mouth daily.   Yes [provider]  metoprolol tartrate (LOPRESSOR) 25 MG tablet Take 1 tablet (25 mg total) by mouth 2 (two) times daily. Patient taking differently: Take 50 mg by mouth 2 (two) times daily.  06/19/11  Yes GRia Bush MD  nitrofurantoin, macrocrystal-monohydrate, (MACROBID) 100 MG capsule Take 100 mg by mouth 2 (two) times daily.   Yes [provider]  Prenatal Vit-Fe Fumarate-FA (MULTIVITAMIN-PRENATAL) 27-0.8 MG TABS tablet Take 1 tablet by mouth daily at 12 noon.   Yes [provider]  sertraline (ZOLOFT) 100 MG tablet Take 200 mg by mouth daily.    Yes [provider]     Social History: She  reports that she has never smoked. She has never used smokeless tobacco. She reports that she does not drink alcohol or use drugs.  Family History: family history includes Alcohol abuse in her maternal grandfather and paternal grandfather; Arthritis in her maternal grandfather and paternal grandfather; Breast cancer in her paternal grandmother; Colitis in her father; Coronary artery disease (age of onset: 637 in her paternal grandfather; Diabetes in  her father; Heart disease in her maternal grandfather, maternal grandmother, paternal grandfather, and paternal grandmother; Hyperlipidemia in her father; Hypertension in her father, maternal grandmother, mother, and paternal grandmother; Lung cancer in her maternal grandfather.   Review of Systems: A full review of systems was performed and negative except as noted in the HPI.     O:  BP 113/67 (BP Location: Right Arm)   Pulse (!) 109   Temp 98.7 F (37.1 C) (Oral)   Resp 18   LMP 06/03/2016   SpO2 95%  No results found for this or any previous visit (from the past  48 hour(s)).   Constitutional: NAD, AAOx3  HE/ENT: extraocular movements grossly intact, moist mucous membranes CV: RRR PULM: nl respiratory effort, CTABL     Abd: gravid, non-tender, non-distended, soft      Ext: Non-tender, Nonedmeatous   Psych: mood appropriate, speech normal Pelvic deferred  Results for [name], [name] (MRN [id number]) as of 01/30/2017 10:44  Ref. Range 01/15/2017 11:25  Sodium Latest Ref Range: 135 - 145 mmol/L 136  Potassium Latest Ref Range: 3.5 - 5.1 mmol/L 4.0  Chloride Latest Ref Range: 101 - 111 mmol/L 107  CO2 Latest Ref Range: 22 - 32 mmol/L 21 (L)  Glucose Latest Ref Range: 65 - 99 mg/dL 86  BUN Latest Ref Range: 6 - 20 mg/dL 7  Creatinine Latest Ref Range: 0.44 - 1.00 mg/dL 0.41 (L)  Calcium Latest Ref Range: 8.9 - 10.3 mg/dL 8.8 (L)  Anion gap Latest Ref Range: 5 - 15  8  Alkaline Phosphatase Latest Ref Range: 38 - 126 U/L 116  Albumin Latest Ref Range: 3.5 - 5.0 g/dL 3.0 (L)  AST Latest Ref Range: 15 - 41 U/L 30  ALT Latest Ref Range: 14 - 54 U/L 22  Total Protein Latest Ref Range: 6.5 - 8.1 g/dL 6.9  Total Bilirubin Latest Ref Range: 0.3 - 1.2 mg/dL 1.2  EGFR (African American) Latest Ref Range: >60 mL/min >60  EGFR (Non-African Amer.) Latest Ref Range: >60 mL/min >60  WBC Latest Ref Range: 3.6 - 11.0 K/uL 9.1  RBC Latest Ref Range: 3.80 - 5.20 MIL/uL 4.33  Hemoglobin Latest Ref Range: 12.0 - 16.0 g/dL 13.4  HCT Latest Ref Range: 35.0 - 47.0 % 38.2  MCV Latest Ref Range: 80.0 - 100.0 fL 88.3  MCH Latest Ref Range: 26.0 - 34.0 pg 31.0  MCHC Latest Ref Range: 32.0 - 36.0 g/dL 35.1  RDW Latest Ref Range: 11.5 - 14.5 % 14.5  Platelets Latest Ref Range: 150 - 440 K/uL 128 (L)   CLINICAL DATA:  Low O2 sats, slight sob at this time, on oxygen at this time, patient is [redacted] weeks pregnant, shielded  EXAM: PORTABLE CHEST 1 VIEW  COMPARISON:  11/06/2008  FINDINGS: The heart size and mediastinal contours are within normal limits. Both lungs are  clear. The visualized skeletal structures are unremarkable.  IMPRESSION: No active disease.  Electronically Signed   By: Kathreen Devoid   On: 01/15/2017 11:27  EKG: Normal sinus rhythm Possible Anterior infarct , age undetermined Abnormal ECG No previous ECGs available I reviewed and concur with this report. Electronically signed RX:[id number] MD, DWAYNE ([id number]) on 09/08/2016 12:22:37 PM  NST: A:  Baseline: 135  Variability: moderate Accelerations present x >2 Decelerations absent Time 62mns B: Baseline: 145  Variability: moderate Accelerations present x >2 Decelerations absent Time 250ms  TOCO: irritable, irregular  A/P: [age].o. 2777w5dre for resolved dizzyness and lightheadedness  Labor: not  present.   Fetal Wellbeing: Reassuring Cat 1 tracing. X 2.  Baby B had a dip in HR upon arrival but resolved without intentional intervention and remained category 1 afterward.  Reactive NST   O2 sats improved after 2L [place] and changing of oximeter and placing on different part of body.    After 1L bolus fluid, patient feels better  D/c home stable, precautions reviewed, follow-up as scheduled.   ----- Larey Days, MD Attending Obstetrician and Gynecologist [facility], Department of [facility]

## 2017-02-05 ENCOUNTER — Inpatient Hospital Stay
Admission: EM | Admit: 2017-02-05 | Discharge: 2017-02-05 | Disposition: A | Discharge status: Home / Self Care | Payer: Managed Care, Other (non HMO) | Attending: Obstetrics and Gynecology | Admitting: Obstetrics and Gynecology

## 2017-02-05 DIAGNOSIS — O99113 Other diseases of the blood and blood-forming organs and certain disorders involving the immune mechanism complicating pregnancy, third trimester: Secondary | ICD-10-CM | POA: Insufficient documentation

## 2017-02-05 DIAGNOSIS — O99213 Obesity complicating pregnancy, third trimester: Secondary | ICD-10-CM | POA: Diagnosis not present

## 2017-02-05 DIAGNOSIS — O99343 Other mental disorders complicating pregnancy, third trimester: Secondary | ICD-10-CM | POA: Diagnosis not present

## 2017-02-05 DIAGNOSIS — O133 Gestational [pregnancy-induced] hypertension without significant proteinuria, third trimester: Secondary | ICD-10-CM | POA: Insufficient documentation

## 2017-02-05 DIAGNOSIS — F329 Major depressive disorder, single episode, unspecified: Secondary | ICD-10-CM | POA: Diagnosis not present

## 2017-02-05 DIAGNOSIS — O30043 Twin pregnancy, dichorionic/diamniotic, third trimester: Secondary | ICD-10-CM | POA: Insufficient documentation

## 2017-02-05 DIAGNOSIS — E669 Obesity, unspecified: Secondary | ICD-10-CM | POA: Diagnosis not present

## 2017-02-05 DIAGNOSIS — O09893 Supervision of other high risk pregnancies, third trimester: Secondary | ICD-10-CM | POA: Insufficient documentation

## 2017-02-05 DIAGNOSIS — I951 Orthostatic hypotension: Secondary | ICD-10-CM

## 2017-02-05 DIAGNOSIS — Z86711 Personal history of pulmonary embolism: Secondary | ICD-10-CM | POA: Diagnosis not present

## 2017-02-05 DIAGNOSIS — G90A Postural orthostatic tachycardia syndrome (POTS): Secondary | ICD-10-CM

## 2017-02-05 DIAGNOSIS — Z3A31 31 weeks gestation of pregnancy: Secondary | ICD-10-CM | POA: Diagnosis not present

## 2017-02-05 DIAGNOSIS — Z7982 Long term (current) use of aspirin: Secondary | ICD-10-CM | POA: Insufficient documentation

## 2017-02-05 DIAGNOSIS — O30009 Twin pregnancy, unspecified number of placenta and unspecified number of amniotic sacs, unspecified trimester: Secondary | ICD-10-CM

## 2017-02-05 DIAGNOSIS — R000 Tachycardia, unspecified: Secondary | ICD-10-CM

## 2017-02-05 LAB — CBC
HEMATOCRIT: 36.5 % (ref 35.0–47.0)
Hemoglobin: 12.6 g/dL (ref 12.0–16.0)
MCH: 30.5 pg (ref 26.0–34.0)
MCHC: 34.7 g/dL (ref 32.0–36.0)
MCV: 87.9 fL (ref 80.0–100.0)
PLATELETS: 136 10*3/uL — AB (ref 150–440)
RBC: 4.15 MIL/uL (ref 3.80–5.20)
RDW: 15.3 % — AB (ref 11.5–14.5)
WBC: 9.6 10*3/uL (ref 3.6–11.0)

## 2017-02-05 LAB — COMPREHENSIVE METABOLIC PANEL
ALBUMIN: 2.7 g/dL — AB (ref 3.5–5.0)
ALT: 22 U/L (ref 14–54)
AST: 29 U/L (ref 15–41)
Alkaline Phosphatase: 112 U/L (ref 38–126)
Anion gap: 8 (ref 5–15)
BILIRUBIN TOTAL: 1.2 mg/dL (ref 0.3–1.2)
BUN: 9 mg/dL (ref 6–20)
CO2: 21 mmol/L — ABNORMAL LOW (ref 22–32)
CREATININE: 0.51 mg/dL (ref 0.44–1.00)
Calcium: 8.7 mg/dL — ABNORMAL LOW (ref 8.9–10.3)
Chloride: 106 mmol/L (ref 101–111)
GFR calc Af Amer: >60 mL/min (ref >60)
GLUCOSE: 135 mg/dL — AB (ref 65–99)
POTASSIUM: 3.4 mmol/L — AB (ref 3.5–5.1)
Sodium: 135 mmol/L (ref 135–145)
TOTAL PROTEIN: 6.4 g/dL — AB (ref 6.5–8.1)

## 2017-02-05 LAB — PROTEIN / CREATININE RATIO, URINE
Creatinine, Urine: 123 mg/dL
Protein Creatinine Ratio: 0.49 mg/mg{Cre} — ABNORMAL HIGH (ref 0.00–0.15)
TOTAL PROTEIN, URINE: 60 mg/dL

## 2017-02-05 MED ORDER — HYDRALAZINE HCL 20 MG/ML IJ SOLN: 10.0000 mg | Freq: Once | INTRAMUSCULAR | Status: DC | PRN

## 2017-02-05 MED ORDER — LABETALOL HCL 5 MG/ML IV SOLN
20.0000 mg | INTRAVENOUS | Status: DC | PRN
Start: 2017-02-05 — End: 2017-02-06

## 2017-02-05 MED ORDER — ONDANSETRON HCL 4 MG PO TABS
ORAL_TABLET | ORAL | Status: AC
  Administered 2017-02-05: 4 mg via ORAL
  Filled 2017-02-05: qty 1

## 2017-02-05 MED ORDER — BETAMETHASONE SOD PHOS & ACET 6 (3-3) MG/ML IJ SUSP
INTRAMUSCULAR | Status: AC
  Filled 2017-02-05: qty 1

## 2017-02-05 MED ORDER — ONDANSETRON HCL 4 MG PO TABS
4.0000 mg | ORAL_TABLET | Freq: Once | ORAL | Status: AC
  Administered 2017-02-05: 4 mg via ORAL

## 2017-02-05 MED ORDER — BETAMETHASONE SOD PHOS & ACET 6 (3-3) MG/ML IJ SUSP
12.0000 mg | INTRAMUSCULAR | Status: DC
  Administered 2017-02-05: 12 mg via INTRAMUSCULAR

## 2017-02-05 NOTE — Progress Notes (Addendum)
Consult with Dr Leeroy Bockhelsea Ward re: pt status and protein:Creatinine level and Plts. Will give 2 doses of BMZ. Pt aware that she may becoming pre-ecclamptic and will be out of work till further notice. Pt to remain at home on modified bedrest and recheck BP early next week. Strict instructions for HA, visual changes and RUQ pain to return here STAT. Will get neonatology consult now. Dc home to rest and fu as scheduled. Note to stay out of work till further notice. No UC's noted, no LOF, NO VB and no decreased FM. Twin A: 125, +accels to 150 , no decels Reactive NST with 2 accels 15 x 15 BPM Twin B: Yellow 10, +accels noted, no decels, variables improved,  Reactive NST with 2 accels 15 x 15 BPM. DOS: 02/05/17 Signed: Milon Scorearon Jones, RN, MSN, CNM, FNP

## 2017-02-05 NOTE — Discharge Instructions (Signed)
Nausea, Adult  Nausea is the feeling of an upset stomach or having to vomit. Nausea on its own is not usually a serious concern, but it may be an early sign of a more serious medical problem. As nausea gets worse, it can lead to vomiting. If vomiting develops, or if you are not able to drink enough fluids, you are at risk of becoming dehydrated. Dehydration can make you tired and thirsty, cause you to have a dry mouth, and decrease how often you urinate. Older adults and people with other diseases or a weak immune system are at higher risk for dehydration. The main goals of treating your nausea are:  · To limit repeated nausea episodes.  · To prevent vomiting and dehydration.    Follow these instructions at home:  Follow instructions from your health care provider about how to care for yourself at home.  Eating and drinking   Follow these recommendations as told by your health care provider:  · Take an oral rehydration solution (ORS). This is a drink that is sold at pharmacies and retail stores.  · Drink clear fluids in small amounts as you are able. Clear fluids include water, ice chips, diluted fruit juice, and low-calorie sports drinks.  · Eat bland, easy-to-digest foods in small amounts as you are able. These foods include bananas, applesauce, rice, lean meats, toast, and crackers.  · Avoid drinking fluids that contain a lot of sugar or caffeine, such as energy drinks, sports drinks, and soda.  · Avoid alcohol.  · Avoid spicy or fatty foods.    General instructions   · Drink enough fluid to keep your urine clear or pale yellow.  · Wash your hands often. If soap and water are not available, use hand sanitizer.  · Make sure that all people in your household wash their hands well and often.  · Rest at home while you recover.  · Take over-the-counter and prescription medicines only as told by your health care provider.  · Breathe slowly and deeply when you feel nauseous.  · Watch your condition for any  changes.  · Keep all follow-up visits as told by your health care provider. This is important.  Contact a health care provider if:  · You have a headache.  · You have new symptoms.  · Your nausea gets worse.  · You have a fever.  · You feel light-headed or dizzy.  · You vomit.  · You cannot keep fluids down.  Get help right away if:  · You have pain in your chest, neck, arm, or jaw.  · You feel extremely weak or you faint.  · You have vomit that is bright red or looks like coffee grounds.  · You have bloody or black stools or stools that look like tar.  · You have a severe headache, a stiff neck, or both.  · You have severe pain, cramping, or bloating in your abdomen.  · You have a rash.  · You have difficulty breathing or are breathing very quickly.  · Your heart is beating very quickly.  · Your skin feels cold and clammy.  · You feel confused.  · You have pain when you urinate.  · You have signs of dehydration, such as:  ? Dark urine, very little, or no urine.  ? Cracked lips.  ? Dry mouth.  ? Sunken eyes.  ? Sleepiness.  ? Weakness.  These symptoms may represent a serious problem that is an emergency. Do   not wait to see if the symptoms will go away. Get medical help right away. Call your local emergency services (911 in the [place] Do not drive yourself to the hospital.  This information is not intended to replace advice given to you by your health care provider. Make sure you discuss any questions you have with your health care provider.  Document Released: 09/25/2004 Document Revised: 01/21/2016 Document Reviewed: 04/24/2015  Elsevier Interactive Patient Education © 2017 [employer] Inc[employer]

## 2017-02-05 NOTE — OB Triage Note (Signed)
Pt presents from the doctors office for high blood pressure. Denies any Pain, LOf, or bleeding. Pt does feel nauseous and clammy and states she has a headache.  Denies any blurred vision, epigastric pain. Pt claims she has had plenty to eat and drink today.Reports positive fetal movement. Will continue to monitor

## 2017-02-05 NOTE — Discharge Summary (Signed)
Obstetric Discharge Summary   Patient ID: Debra Terry MRN: [id number] DOB/AGE: [birth year] 31 y.o.   Date of Admission: 02/05/2017  Date of Discharge: 02/05/17  Admitting Diagnosis: Twin pregnancy  at 7634w5d  Secondary Diagnosis: Elevated BP x 1 at office today  Mode of Delivery: N/A Discharge Diagnosis: Twin pregnancy with normal BP   Intrapartum Procedures: None yet   Post partum procedures: N/A  Complications: low Platelets but, they have increased since last month   Brief Hospital Course (Triage) Debra Terry is a [id number] who was sent from the office for one BP which was elevated.;  Pt also had nausea today and was [facility] Zo[facility]an [facility] for her symptoms. She also stated that she has had a HA in the temples today. No blurred vision, no RUQ pain noted.  NST of both Twins was reactive with 2 accels 15 x 15 BPM for Twin A and B.  She was deemed stable for discharge to home.     Labs: CBC Latest Ref Rng & Units 02/05/2017 01/15/2017 04/28/2014  WBC 3.6 - 11.0 K/uL 9.6 9.1 8.3  Hemoglobin 12.0 - 16.0 g/dL 14.712.6 82.913.4 56.215.2  Hematocrit 35.0 - 47.0 % 36.5 38.2 45.2  Platelets 150 - 440 K/uL 136(L) 128(L) 189    Physical exam:  Blood pressure 118/74, pulse 89, temperature 98 F (36.7 C), temperature source Oral, resp. rate 20, height 5\' 11"  (1.803 m), weight (!) 312 lb (141.5 kg), last menstrual period 06/03/2016, SpO2 97 %. General: alert and no distress HEENT: Eyes non-citeric, Normocephalic Resp:reg and non-labored Abdomen: Gravid.  Extremities: No evidence of DVT seen on physical exam. No lower extremity edema.  Discharge Instructions: Per After Visit Summary. Activity: Advance as tolerated. May resume as able  Also refer to After Visit Summary Diet: Regular Medications:PNV,   Outpatient follow up:  Postpartum contraception:   Discharged Condition: Stable   Discharged to: home     Sharee PimpleCaron W Zahira Brummond, [place] 02/05/2017

## 2017-02-05 NOTE — Progress Notes (Addendum)
Debra Terry is a 31 y.o. female. She is at [redacted]w[redacted]d gestation. Patient's last menstrual period was 06/03/2016. Estimated Date of Delivery: 04/04/17. Pt is high risk for Di-Di Twins, no lOF, NO UC's, NO decreased FM or VB. Prenatal care site: [facility]   Chief complaint:Sent from office for elevated BP with diastolic of 100 and now BP is normalized in [facility].   Location: Elevated BP in office and sent over for serial BP's Onset/timing:This afternoon Duration:2 hours  Quality: none Severity: no BP elevation in [facility]  Aggravating or alleviating conditions:none Associated signs/symptoms:sweaty today  Context:none  S: Resting comfortably. no CTX, no VB.no LOF,  Active fetal movement.   Maternal Medical History:   Past Medical History:  Diagnosis Date  . Allergic rhinitis   . Complication of anesthesia    woke during T&A  . Depression    lost child 08/2011  . Generalized headaches    frequent  . H/O: menorrhagia    to start depo for this  . 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 History of syncope    neurocardiogenic per pt  . Hx: UTI (urinary tract infection)   . Lupus anticoagulant positive   . Migraine headache    h/o admission at [facility] for this  . Motion sickness    all moving vehicles  . Obesity    lost >100 lbs, now gained back some, previously ran 2 mi in am 1 in pm  . Seasonal allergies   . Wears contact lenses     Past Surgical History:  Procedure Laterality Date  . CESAREAN SECTION    . COLONOSCOPY WITH PROPOFOL N/A 05/16/2015   Procedure: COLONOSCOPY WITH PROPOFOL;  Surgeon: Midge Minium, MD;  Location: [facility];  Service: Endoscopy;  Laterality: N/A;  . EXPLORATORY LAPAROTOMY  2006   Excessive vaginal bleeding  . EYE SURGERY  1999  . TONSILLECTOMY AND ADENOIDECTOMY  1998    Allergies  Allergen Reactions  . Warfarin Sodium     H/o GI  bleed.      Prior to Admission medications   Medication Sig Start Date End Date Taking? Authorizing Provider  aspirin EC 81 MG tablet Take 81 mg by mouth daily.   Yes [provider]  folic acid (FOLVITE) 1 MG tablet Take 1 mg by mouth daily.   Yes [provider]  metoprolol tartrate (LOPRESSOR) 25 MG tablet Take 1 tablet (25 mg total) by mouth 2 (two) times daily. Patient taking differently: Take 50 mg by mouth 2 (two) times daily.  06/19/11  Yes Eustaquio Boyden, MD  Prenatal Vit-Fe Fumarate-FA (MULTIVITAMIN-PRENATAL) 27-0.8 MG TABS tablet Take 1 tablet by mouth daily at 12 noon.   Yes [provider]  sertraline (ZOLOFT) 100 MG tablet Take 200 mg by mouth daily.    Yes [provider]  nitrofurantoin, macrocrystal-monohydrate, (MACROBID) 100 MG capsule Take 100 mg by mouth 2 (two) times daily.    [provider]     Social History: She  reports that she has never smoked. She has never used smokeless tobacco. She reports that she does not drink alcohol or use drugs.  Family History: family history includes Alcohol abuse in her maternal grandfather and paternal grandfather; Arthritis in her maternal grandfather and paternal grandfather; Breast cancer in her paternal grandmother; Colitis in her father; Coronary artery disease (age of onset: 82)  in her paternal grandfather; Diabetes in her father; Heart disease in her maternal grandfather, maternal grandmother, paternal grandfather, and paternal grandmother; Hyperlipidemia in her father; Hypertension in her father, maternal grandmother, mother, and paternal grandmother; Lung cancer in her maternal grandfather. no  history of gyn cancers  Review of Systems: A full review of systems was performed and negative except as noted in the HPI.  +HA today at intervals in both sides of temples, no visual changes and no RUQ pain.    O:  BP 111/77 (BP Location: Left Arm)   Pulse (!) 103   Temp 98 F (36.7 C)  (Oral)   Resp 20   Ht 5\' 11"  (1.803 m)   Wt (!) 312 lb (141.5 kg)   LMP 06/03/2016   SpO2 97%   BMI 43.52 kg/m  No results found for this or any previous visit (from the past 48 hour(s)).   Constitutional: NAD, AAOx3  HE/ENT: extraocular movements grossly intact, moist mucous membranes CV: RRR PULM: nl respiratory effort, CTABL     Abd: gravid, non-tender, non-distended, soft      Ext: Non-tender, Nonedmeatous   Psych: mood appropriate, speech normal Pelvic deferred  NST: Twin A:140, Mod variability, no decels, +accels Twin B: 150 accels, down to 90 with variables, basline 140, no concerning decels.  Baseline: per above  Variability: moderate Accelerations present x >2 Decelerations absent Time 20mins    A/P: 31 y.o. 4275w5d here for antenatal surveillance for elevated BP.   Labor: not present.   Fetal Wellbeing: Reassuring Cat 2 tracing.  Reactive NST   D/c home stable, precautions reviewed, follow-up as scheduled.   ----- Sharee Pimplearon W. Jones, RN, MSN, cNM, FNP [facility], Department of OB/GYN [facility] Date of service: 02/05/17

## 2017-02-06 ENCOUNTER — Inpatient Hospital Stay
Admission: EM | Admit: 2017-02-06 | Discharge: 2017-02-06 | Disposition: A | Discharge status: Home / Self Care | Payer: Managed Care, Other (non HMO) | Attending: Obstetrics & Gynecology | Admitting: Obstetrics & Gynecology

## 2017-02-06 DIAGNOSIS — R030 Elevated blood-pressure reading, without diagnosis of hypertension: Secondary | ICD-10-CM | POA: Diagnosis not present

## 2017-02-06 DIAGNOSIS — Z3A31 31 weeks gestation of pregnancy: Secondary | ICD-10-CM | POA: Insufficient documentation

## 2017-02-06 DIAGNOSIS — O99113 Other diseases of the blood and blood-forming organs and certain disorders involving the immune mechanism complicating pregnancy, third trimester: Secondary | ICD-10-CM | POA: Insufficient documentation

## 2017-02-06 DIAGNOSIS — O30043 Twin pregnancy, dichorionic/diamniotic, third trimester: Secondary | ICD-10-CM | POA: Diagnosis not present

## 2017-02-06 DIAGNOSIS — O0993 Supervision of high risk pregnancy, unspecified, third trimester: Secondary | ICD-10-CM | POA: Insufficient documentation

## 2017-02-06 DIAGNOSIS — O1213 Gestational proteinuria, third trimester: Secondary | ICD-10-CM | POA: Insufficient documentation

## 2017-02-06 MED ORDER — BETAMETHASONE SOD PHOS & ACET 6 (3-3) MG/ML IJ SUSP
INTRAMUSCULAR | Status: AC
  Administered 2017-02-06: 12 mg via INTRAMUSCULAR
  Filled 2017-02-06: qty 1

## 2017-02-06 MED ORDER — BETAMETHASONE SOD PHOS & ACET 6 (3-3) MG/ML IJ SUSP
12.0000 mg | Freq: Once | INTRAMUSCULAR | Status: AC
  Administered 2017-02-06: 12 mg via INTRAMUSCULAR

## 2017-02-06 NOTE — OB Triage Note (Signed)
Patient here for second dose of Betamethasone. Patient denies any contractions, bleeding, and leaking of fluid. Patient states she is feeling baby A and baby B move normal.

## 2017-02-09 ENCOUNTER — Observation Stay
Admission: RE | Admit: 2017-02-09 | Discharge: 2017-02-09 | Disposition: A | Discharge status: Home / Self Care | Payer: Managed Care, Other (non HMO) | Attending: Obstetrics and Gynecology | Admitting: Obstetrics and Gynecology

## 2017-02-09 DIAGNOSIS — O30043 Twin pregnancy, dichorionic/diamniotic, third trimester: Secondary | ICD-10-CM | POA: Diagnosis not present

## 2017-02-09 DIAGNOSIS — O30009 Twin pregnancy, unspecified number of placenta and unspecified number of amniotic sacs, unspecified trimester: Secondary | ICD-10-CM

## 2017-02-09 DIAGNOSIS — Z3A32 32 weeks gestation of pregnancy: Secondary | ICD-10-CM | POA: Diagnosis not present

## 2017-02-09 DIAGNOSIS — O09893 Supervision of other high risk pregnancies, third trimester: Principal | ICD-10-CM | POA: Insufficient documentation

## 2017-02-09 NOTE — Progress Notes (Addendum)
NST performed today was reviewed and was found to be reactive.  Continue recommended antenatal testing and prenatal care. Twins at 6432 2/7 weeks here for NST today. Baby A is in the LLQ and blue on monitor. 2 accels 15 x 15 BPM noted with mod variaiblity and no decels. Baby B: reactice NST with 2 accels 15 x 15 BPM, no decels. Both twins are reactive today. Pt has no complaints. BP stable 125/87. Pt has had 2 BMZ's as she may be at risk for pre-ecclampsia due to increasing P:C ratio.

## 2017-02-09 NOTE — Discharge Instructions (Signed)
Multiple Pregnancy Having a multiple pregnancy means that a woman is carrying more than one baby at a time. She may be pregnant with twins, triplets, or more. The majority of multiple pregnancies are twins. Naturally conceiving triplets or more (higher-order multiples) is rare. Multiple pregnancies are riskier than single pregnancies. A woman with a multiple pregnancy is more likely to have certain problems during her pregnancy. Therefore, she will need to have more frequent appointments for prenatal care. How does a multiple pregnancy happen? A multiple pregnancy happens when:  The woman's body releases more than one egg at a time, and then each egg gets fertilized by a different sperm. ? This is the most common type of multiple pregnancy. ? Twins or other multiples produced this way are fraternal. They are no more alike than non-multiple siblings are.  One sperm fertilizes one egg, which then divides into more than one embryo. ? Twins or other multiples produced this way are identical. Identical multiples are always the same gender, and they look very much alike.  Who is most likely to have a multiple pregnancy? A multiple pregnancy is more likely to develop in women who:  Have had fertility treatment, especially if the treatment included fertility drugs.  Are older than 30 years of age.  Have already had four or more children.  Have a family history of multiple pregnancy.  How is a multiple pregnancy diagnosed? A multiple pregnancy may be diagnosed based on:  Symptoms such as: ? Rapid weight gain in the first 3 months of pregnancy (first trimester). ? More severe nausea and breast tenderness than what is typical of a single pregnancy. ? The uterus measuring larger than what is normal for the stage of the pregnancy.  Blood tests that detect a higher-than-normal level of human chorionic gonadotropin (hCG). This is a hormone that your body produces in early pregnancy.  Ultrasound  exam. This is used to confirm that you are carrying multiples.  What risks are associated with multiple pregnancy? A multiple pregnancy puts you at a higher risk for certain problems during or after your pregnancy, including:  Having your babies delivered before you have reached a full-term pregnancy (preterm birth). A full-term pregnancy lasts for at least 37 weeks. Babies born before 37 weeks may have a higher risk of a variety of health problems, such as breathing problems, feeding difficulties, cerebral palsy, and learning disabilities.  Diabetes.  Preeclampsia. This is a serious condition that causes high blood pressure along with other symptoms, such as swelling and headaches, during pregnancy.  Excessive blood loss after childbirth (postpartum hemorrhage).  Postpartum depression.  Low birth weight of the babies.  How will having a multiple pregnancy affect my care? Your health care provider will want to monitor you more closely during your pregnancy to make sure that your babies are growing normally and that you are healthy. Follow these instructions at home: Because your pregnancy is considered to be high risk, you will need to work closely with your health care team. You may also need to make some lifestyle changes. These may include the following: Eating and drinking  Increase your nutrition. ? Follow your health care provider's recommendations for weight gain. You may need to gain a little extra weight when you are pregnant with multiples. ? Eat healthy snacks often throughout the day. This can add calories and reduce nausea.  Drink enough fluid to keep your urine clear or pale yellow.  Take prenatal vitamins. Activity By 20-24 weeks, you may   need to limit your activities.  Avoid activities and work that take a lot of effort (are strenuous).  Ask your health care provider when you should stop having sexual intercourse.  Rest often.  General instructions  Do not use  any products that contain nicotine or tobacco, such as cigarettes and e-cigarettes. If you need help quitting, ask your health care provider.  Do not drink alcohol or use illegal drugs.  Take over-the-counter and prescription medicines only as told by your health care provider.  Arrange for extra help around the house.  Keep all follow-up visits and all prenatal visits as told by your health care provider. This is important. Contact a health care provider if:  You have dizziness.  You have persistent nausea, vomiting, or diarrhea.  You are having trouble gaining weight.  You have feelings of depression or other emotions that are interfering with your normal activities. Get help right away if:  You have a fever.  You have pain with urination.  You have fluid leaking from your vagina.  You have a bad-smelling vaginal discharge.  You notice increased swelling in your face, hands, legs, or ankles.  You have spotting or bleeding from your vagina.  You have pelvic cramps, pelvic pressure, or nagging pain in your abdomen or lower back.  You are having regular contractions.  You develop a severe headache, with or without visual changes.  You have shortness of breath or chest pain.  You notice less fetal movement, or no fetal movement. This information is not intended to replace advice given to you by your health care provider. Make sure you discuss any questions you have with your health care provider. Document Released: 05/27/2008 Document Revised: 04/18/2016 Document Reviewed: 04/18/2016 Elsevier Interactive Patient Education  2018 Elsevier Inc.  

## 2017-02-12 ENCOUNTER — Observation Stay
Admission: RE | Admit: 2017-02-12 | Discharge: 2017-02-12 | Disposition: A | Discharge status: Home / Self Care | Payer: Managed Care, Other (non HMO) | Attending: Obstetrics and Gynecology | Admitting: Obstetrics and Gynecology

## 2017-02-12 DIAGNOSIS — Z79899 Other long term (current) drug therapy: Secondary | ICD-10-CM | POA: Insufficient documentation

## 2017-02-12 DIAGNOSIS — F329 Major depressive disorder, single episode, unspecified: Secondary | ICD-10-CM | POA: Diagnosis not present

## 2017-02-12 DIAGNOSIS — O30003 Twin pregnancy, unspecified number of placenta and unspecified number of amniotic sacs, third trimester: Secondary | ICD-10-CM | POA: Diagnosis not present

## 2017-02-12 DIAGNOSIS — O99213 Obesity complicating pregnancy, third trimester: Secondary | ICD-10-CM | POA: Diagnosis not present

## 2017-02-12 DIAGNOSIS — O1493 Unspecified pre-eclampsia, third trimester: Principal | ICD-10-CM | POA: Insufficient documentation

## 2017-02-12 DIAGNOSIS — Z3A32 32 weeks gestation of pregnancy: Secondary | ICD-10-CM | POA: Insufficient documentation

## 2017-02-12 DIAGNOSIS — E669 Obesity, unspecified: Secondary | ICD-10-CM | POA: Insufficient documentation

## 2017-02-12 DIAGNOSIS — O99343 Other mental disorders complicating pregnancy, third trimester: Secondary | ICD-10-CM | POA: Diagnosis not present

## 2017-02-12 DIAGNOSIS — Z7982 Long term (current) use of aspirin: Secondary | ICD-10-CM | POA: Insufficient documentation

## 2017-02-12 NOTE — Discharge Summary (Addendum)
Obstetric Discharge Summary   Patient ID: Debra Terry MRN: [id number] DOB/AGE: [birth year] [age] y.o.   Date of Admission: 02/12/17 Date of Discharge: 02/12/17  Admitting Diagnosis: IUP  at 2729w5d  Secondary Diagnosis: Twins, pre-ecclampsia w/proteinuria, Pt has had 2 doses of steroids (BMZ).   Mode of Delivery: N/A     Discharge Diagnosis: IUP at 32 5/7 weeks with twins and pre-ecclampsia and reactive NST's Intrapartum Procedures: N/A   Post partum procedures: N/A  Complications: 2 Elevated BP in the past with pre-ecclampsia    Brief Hospital Course  Debra Terry is a G3P1011 who had an elevated diastolic last week and then again this week with proteinuria. NST of twins reactive today. Continue to observe and watch BP and proteinuria. Watch CNS symptoms.    Labs: CBC Latest Ref Rng & Units 02/05/2017 01/15/2017 04/28/2014  WBC 3.6 - 11.0 K/uL 9.6 9.1 8.3  Hemoglobin 12.0 - 16.0 g/dL 40.912.6 81.113.4 91.415.2  Hematocrit 35.0 - 47.0 % 36.5 38.2 45.2  Platelets 150 - 440 K/uL 136(L) 128(L) 189    Physical exam:  Last menstrual period 06/03/2016. General: alert and no distress to nursing  HEENT: Normocephalic, Eyes non-icteric Resp:reg and non-labored CV: no JVD Abd: Gravid Legs: +edema noted of lower legs    Discharge Instructions: Pre-ecclampsia with proteinuria and no severe features. IUP at 33 2/7 weeks with Twins.  Activity: Modified BR (Sofa rest)  Diet: Regular Medications: Allergies as of 02/12/2017      Reactions   Warfarin Sodium    H/o GI bleed.        Medication List    Notice   Cannot display discharge medications because the patient has not yet been admitted.    [facility] Discharged Condition: stable   Discharged to: home, Advised to stay on Sofa for majority of time and avoid long term standing, push po fluids, [facility]  with Dr Elesa MassedWard advised to continue pregnancy until  pre-ecclampsia with severe features is present. Proteinuria will no longer drive decision making for this pt per Dr Elesa MassedWard and the criteria for delivery.   Debra Terry, CNM  [facility] 02/12/2017

## 2017-02-12 NOTE — Progress Notes (Signed)
Debra Terry is a 31 y.o. female. She is at 2465w5d gestation. Patient's last menstrual period was 06/03/2016. Estimated Date of Delivery: 04/04/17. Pt has   [facility]   Chief complaint: Here for NST of twins with 2 elevated BP's and proteinuria of 490 on 02/05/17 & [facility] the high 300's. NO HA, NO blurred vision or RUQ pain per nursing.    S: Resting comfortably. no CTX, no VB.no LOF,  Active fetal movement.  Maternal Medical History:   Past Medical History:  Diagnosis Date  . Allergic rhinitis   . Complication of anesthesia    woke during T&A  . Depression    lost child 08/2011  . Generalized headaches    frequent  . H/O: menorrhagia    to start depo for this  . 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 History of syncope    neurocardiogenic per pt  . Hx: UTI (urinary tract infection)   . Lupus anticoagulant positive   . Migraine headache    [facility]  . Motion sickness    all moving vehicles  . Obesity    lost >100 lbs, now gained back some, previously ran 2 mi in am 1 in pm  . Seasonal allergies   . Wears contact lenses     Past Surgical History:  Procedure Laterality Date  . CESAREAN SECTION    . COLONOSCOPY WITH PROPOFOL N/A 05/16/2015   Procedure: COLONOSCOPY WITH PROPOFOL;  Surgeon: Midge Miniumarren Wohl, MD;  Location[facility];  Service: Endoscopy;  Laterality: N/A;  . EXPLORATORY LAPAROTOMY  2006   Excessive vaginal bleeding  . EYE SURGERY  1999  . TONSILLECTOMY AND ADENOIDECTOMY  1998    Allergies  Allergen Reactions  . Warfarin Sodium     H/o GI bleed.      Prior to Admission medications   Medication Sig Start Date End Date Taking? Authorizing Provider  aspirin EC 81 MG tablet Take 81 mg by mouth daily.    [provider]  folic acid (FOLVITE) 1 MG tablet Take 1 mg by mouth daily.     [provider]  metoprolol tartrate (LOPRESSOR) 25 MG tablet Take 1 tablet (25 mg total) by mouth 2 (two) times daily. Patient taking differently: Take 50 mg by mouth 2 (two) times daily.  06/19/11   Eustaquio BoydenGutierrez, Javier, MD  nitrofurantoin, macrocrystal-monohydrate, (MACROBID) 100 MG capsule Take 100 mg by mouth 2 (two) times daily.    [provider]  Prenatal Vit-Fe Fumarate-FA (MULTIVITAMIN-PRENATAL) 27-0.8 MG TABS tablet Take 1 tablet by mouth daily at 12 noon.    [provider]  sertraline (ZOLOFT) 100 MG tablet Take 200 mg by mouth daily.     [provider]     Social History: She  reports that she has never smoked. She has never used smokeless tobacco. She reports that she does not drink alcohol or use drugs.  Family History: family history includes Alcohol abuse in her maternal grandfather and paternal grandfather; Arthritis in her maternal grandfather and paternal grandfather; Breast cancer in her paternal grandmother; Colitis in her father; Coronary artery disease (age of onset: 5660) in her paternal grandfather; Diabetes in her father; Heart disease in her maternal grandfather, maternal grandmother, paternal grandfather, and paternal grandmother; Hyperlipidemia in her father; Hypertension in her father,  maternal grandmother, mother, and paternal grandmother; Lung cancer in her maternal grandfather.  no history of gyn cancers  Review of Systems: A full review of systems was performed and negative except as noted in the HPI.     O:  BP 123/82   Pulse (!) 112   Temp 98 F (36.7 C) (Oral)   Resp 16   LMP 06/03/2016  No results found for this or any previous visit (from the past 48 hour(s)).   Constitutional: NAD, AAOx3  HE/ENT: extraocular movements grossly intact, moist mucous membranes CV: RRR PULM: nl respiratory effort, CTABL     Abd: gravid, non-tender, non-distended, soft      Ext: Non-tender, Nonedmeatous   Psych: mood appropriate,  speech normal  Twins: Baby "A'  NST: Reactive w/2accels 15 x 15 BPM Baseline: 155 Variability: moderate Accelerations present x >2 Decelerations absent Time  Baby "B" NST: Reactive  NST : Reactive w/2 accels 15 x 15 BPM Baseline: 150 Accels x 2, no Decels   A/P: 31 y.o. [redacted]w[redacted]d here for antenatal surveillance for Twins and  Pre-ecclampsia  Labor: not present.   Fetal Wellbeing: Reassuring Cat 1 tracing.  Reactive NST of both twins  D/c home stable, precautions reviewed, follow-up as scheduled.   BP in normal range today  Disc with Dr Bernestine Amass and agrees with monitoring of BP's, proteinuria and NST 2 x a week, Growth [place] and AFI as scheduled  Will observe BP's closely. Pt is no longer working and at home and can rest.   ----- Debra Pimple, RN, MSN, CNM, FNP [facility], Department of OB/GYN [facility]

## 2017-02-16 ENCOUNTER — Inpatient Hospital Stay
Admission: RE | Admit: 2017-02-16 | Discharge: 2017-02-16 | Disposition: A | Discharge status: Home / Self Care | Payer: Managed Care, Other (non HMO) | Attending: Obstetrics & Gynecology | Admitting: Obstetrics & Gynecology

## 2017-02-16 ENCOUNTER — Inpatient Hospital Stay: Admission: RE | Admit: 2017-02-16 | Payer: Managed Care, Other (non HMO) | Source: Ambulatory Visit

## 2017-02-16 DIAGNOSIS — O99343 Other mental disorders complicating pregnancy, third trimester: Secondary | ICD-10-CM | POA: Insufficient documentation

## 2017-02-16 DIAGNOSIS — O99213 Obesity complicating pregnancy, third trimester: Secondary | ICD-10-CM | POA: Diagnosis not present

## 2017-02-16 DIAGNOSIS — Z7982 Long term (current) use of aspirin: Secondary | ICD-10-CM | POA: Insufficient documentation

## 2017-02-16 DIAGNOSIS — F329 Major depressive disorder, single episode, unspecified: Secondary | ICD-10-CM | POA: Diagnosis not present

## 2017-02-16 DIAGNOSIS — Z79899 Other long term (current) drug therapy: Secondary | ICD-10-CM | POA: Diagnosis not present

## 2017-02-16 DIAGNOSIS — O1213 Gestational proteinuria, third trimester: Secondary | ICD-10-CM | POA: Insufficient documentation

## 2017-02-16 DIAGNOSIS — O30009 Twin pregnancy, unspecified number of placenta and unspecified number of amniotic sacs, unspecified trimester: Secondary | ICD-10-CM

## 2017-02-16 DIAGNOSIS — Z3A33 33 weeks gestation of pregnancy: Secondary | ICD-10-CM | POA: Insufficient documentation

## 2017-02-16 LAB — PROTEIN / CREATININE RATIO, URINE
CREATININE, URINE: 308 mg/dL
Protein Creatinine Ratio: 0.69 mg/mg{Cre} — ABNORMAL HIGH (ref 0.00–0.15)
Total Protein, Urine: 214 mg/dL

## 2017-02-16 MED ORDER — LABETALOL HCL 5 MG/ML IV SOLN: 20.0000 mg | INTRAVENOUS | Status: DC | PRN

## 2017-02-16 NOTE — OB Triage Note (Signed)
Scheduled NST 

## 2017-02-16 NOTE — Progress Notes (Addendum)
Debra Terry is a 31 y.o. female. She is at [redacted]w[redacted]d gestation. Patient's last menstrual period was 06/03/2016.  Estimated Date of Delivery: 04/04/17  Prenatal care site: [facility]   Chief complaint: Here for NST and feeling some cramping mild  Location:lower abd Onset/timing: today Duration:occas Quality: mild Severity:none Aggravating or alleviating conditions: Elevated BP today  Associated signs/symptoms:No HA, RUQ pain or blurred vision   S: Resting comfortably. no CTX, no VB.no LOF,  Active fetal movement. +  Maternal Medical History:   Past Medical History:  Diagnosis Date  . Allergic rhinitis   . Complication of anesthesia    woke during T&A  . Depression    lost child 08/2011  . Generalized headaches    frequent  . H/O: menorrhagia    to start depo for this  . 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 History of syncope    neurocardiogenic per pt  . Hx: UTI (urinary tract infection)   . Lupus anticoagulant positive   . Migraine headache    h/o admission at [facility] for this  . Motion sickness    all moving vehicles  . Obesity    lost >100 lbs, now gained back some, previously ran 2 mi in am 1 in pm  . Seasonal allergies   . Wears contact lenses     Past Surgical History:  Procedure Laterality Date  . CESAREAN SECTION    . COLONOSCOPY WITH PROPOFOL N/A 05/16/2015   Procedure: COLONOSCOPY WITH PROPOFOL;  Surgeon: Midge Minium, MD;  Location: [facility];  Service: Endoscopy;  Laterality: N/A;  . EXPLORATORY LAPAROTOMY  2006   Excessive vaginal bleeding  . EYE SURGERY  1999  . TONSILLECTOMY AND ADENOIDECTOMY  1998    Allergies  Allergen Reactions  . Warfarin Sodium     H/o GI bleed.      Prior to Admission medications   Medication Sig Start Date End Date Taking? Authorizing Provider  aspirin EC 81 MG tablet Take 81 mg by mouth daily.    [provider]  folic acid (FOLVITE) 1 MG tablet Take 1 mg by mouth daily.    [provider]  metoprolol tartrate (LOPRESSOR) 25 MG tablet Take 1 tablet (25 mg total) by mouth 2 (two) times daily. Patient taking differently: Take 50 mg by mouth 2 (two) times daily.  06/19/11   Eustaquio Boyden, MD  nitrofurantoin, macrocrystal-monohydrate, (MACROBID) 100 MG capsule Take 100 mg by mouth 2 (two) times daily.    [provider]  Prenatal Vit-Fe Fumarate-FA (MULTIVITAMIN-PRENATAL) 27-0.8 MG TABS tablet Take 1 tablet by mouth daily at 12 noon.    [provider]  sertraline (ZOLOFT) 100 MG tablet Take 200 mg by mouth daily.     [provider]     Social History: She  reports that she has never smoked. She has never used smokeless tobacco. She reports that she does not drink alcohol or use drugs.  Family History: family history includes Alcohol abuse in her maternal grandfather and paternal grandfather; Arthritis in her maternal grandfather and paternal grandfather; Breast cancer in her paternal grandmother; Colitis in her father; Coronary artery disease (age of onset: 7) in her paternal grandfather; Diabetes in her father; Heart disease in her maternal grandfather, maternal grandmother, paternal grandfather, and paternal grandmother; Hyperlipidemia in her father; Hypertension in her father, maternal grandmother, mother,  and paternal grandmother; Lung cancer in her maternal grandfather.  no history of gyn cancers  Review of Systems: A full review of systems was performed and negative except as noted in the HPI.     O:  BP 131/85   Pulse (!) 113   Temp 98.1 F (36.7 C) (Oral)   Resp 20   LMP 06/03/2016  No results found for this or any previous visit (from the past 48 hour(s)).   Constitutional: NAD, AAOx3  HE/ENT: extraocular movements grossly intact, moist mucous membranes CV: No JVD PULM: Resp reg and non-labored    Abd: gravid, non-tender,  non-distended, soft      Ext: Non-tender, Nonedmeatous   Psych: mood appropriate, speech normal Pelvic deferred  NST: Twin A reactive with 2 accels 15 x 15 BPM Twin B reactive with 2 accels 15 x 15 BPM   A/P: 31 y.o. 4967w2d here for antenatal surveillance for Twin pregnancy with pre-ecclampsia  Labor: not present.   Fetal Wellbeing: Reassuring Cat 1 tracing of both twins.  IUP at 33 2/7 weeks with twins and pre-ecclampsia with no severe features  Reactive NST x 2 P:C ratio:690 today  Disc with Dr Elesa MassedWard and she will discuss findings with [facility] on when to deliver pt. In the meantime, she can go home on modified rest (Sofa activities) and RTH on Thursday for NST and appts as scheduled for growth US. Pt aware of pre-ecclampsia and denies any severe features: blurred vision, RUQ pain, HA.  -----  Addendum: Dr Elesa MassedWard spoke with Dr Leilani MerlGrotecut and called me at 1815 and stated that "pt can continue on in the pregnancy and pt is pre-ecclamptic with no severe features. Dr Leilani MerlGrotecut states to use BP's with severe features for making decision to deliver pt. Proteinuria is no longer significant for this pt. With the new criteria for management of pre-ecclampsia.  Debra Terry, CNM [facility], Department of OB/GYN [facility]

## 2017-02-16 NOTE — Discharge Instructions (Signed)
Preeclampsia and Eclampsia °Preeclampsia is a serious condition that develops only during pregnancy. It is also called toxemia of pregnancy. This condition causes high blood pressure along with other symptoms, such as swelling and headaches. These symptoms may develop as the condition gets worse. Preeclampsia may occur at 20 weeks of pregnancy or later. °Diagnosing and treating preeclampsia early is very important. If not treated early, it can cause serious problems for you and your baby. One problem it can lead to is eclampsia, which is a condition that causes muscle jerking or shaking (convulsions or seizures) in the mother. Delivering your baby is the best treatment for preeclampsia or eclampsia. Preeclampsia and eclampsia symptoms usually go away after your baby is born. °What are the causes? °The cause of preeclampsia is not known. °What increases the risk? °The following risk factors make you more likely to develop preeclampsia: °· Being pregnant for the first time. °· Having had preeclampsia during a past pregnancy. °· Having a family history of preeclampsia. °· Having high blood pressure. °· Being pregnant with twins or triplets. °· Being 35 or older. °· Being African-American. °· Having kidney disease or diabetes. °· Having medical conditions such as lupus or blood diseases. °· Being very overweight (obese). ° °What are the signs or symptoms? °The earliest signs of preeclampsia are: °· High blood pressure. °· Increased protein in your urine. Your health care provider will check for this at every visit before you give birth (prenatal visit). ° °Other symptoms that may develop as the condition gets worse include: °· Severe headaches. °· Sudden weight gain. °· Swelling of the hands, face, legs, and feet. °· Nausea and vomiting. °· Vision problems, such as blurred or double vision. °· Numbness in the face, arms, legs, and feet. °· Urinating less than usual. °· Dizziness. °· Slurred speech. °· Abdominal pain,  especially upper abdominal pain. °· Convulsions or seizures. ° °Symptoms generally go away after giving birth. °How is this diagnosed? °There are no screening tests for preeclampsia. Your health care provider will ask you about symptoms and check for signs of preeclampsia during your prenatal visits. You may also have tests that include: °· Urine tests. °· Blood tests. °· Checking your blood pressure. °· Monitoring your baby’s heart rate. °· Ultrasound. ° °How is this treated? °You and your health care provider will determine the treatment approach that is best for you. Treatment may include: °· Having more frequent prenatal exams to check for signs of preeclampsia, if you have an increased risk for preeclampsia. °· Bed rest. °· Reducing how much salt (sodium) you eat. °· Medicine to lower your blood pressure. °· Staying in the hospital, if your condition is severe. There, treatment will focus on controlling your blood pressure and the amount of fluids in your body (fluid retention). °· You may need to take medicine (magnesium sulfate) to prevent seizures. This medicine may be given as an injection or through an IV tube. °· Delivering your baby early, if your condition gets worse. You may have your labor started with medicine (induced), or you may have a cesarean delivery. ° °Follow these instructions at home: °Eating and drinking ° °· Drink enough fluid to keep your urine clear or pale yellow. °· Eat a healthy diet that is low in sodium. Do not add salt to your food. Check nutrition labels to see how much sodium a food or beverage contains. °· Avoid caffeine. °Lifestyle °· Do not use any products that contain nicotine or tobacco, such as cigarettes   and e-cigarettes. If you need help quitting, ask your health care provider. °· Do not use alcohol or drugs. °· Avoid stress as much as possible. Rest and get plenty of sleep. °General instructions °· Take over-the-counter and prescription medicines only as told by your  health care provider. °· When lying down, lie on your side. This keeps pressure off of your baby. °· When sitting or lying down, raise (elevate) your feet. Try putting some pillows underneath your lower legs. °· Exercise regularly. Ask your health care provider what kinds of exercise are best for you. °· Keep all follow-up and prenatal visits as told by your health care provider. This is important. °How is this prevented? °To prevent preeclampsia or eclampsia from developing during another pregnancy: °· Get proper medical care during pregnancy. Your health care provider may be able to prevent preeclampsia or diagnose and treat it early. °· Your health care provider may have you take a low-dose aspirin or a calcium supplement during your next pregnancy. °· You may have tests of your blood pressure and kidney function after giving birth. °· Maintain a healthy weight. Ask your health care provider for help managing weight gain during pregnancy. °· Work with your health care provider to manage any long-term (chronic) health conditions you have, such as diabetes or kidney problems. ° °Contact a health care provider if: °· You gain more weight than expected. °· You have headaches. °· You have nausea or vomiting. °· You have abdominal pain. °· You feel dizzy or light-headed. °Get help right away if: °· You develop sudden or severe swelling anywhere in your body. This usually happens in the legs. °· You gain 5 lbs (2.3 kg) or more during one week. °· You have severe: °? Abdominal pain. °? Headaches. °? Dizziness. °? Vision problems. °? Confusion. °? Nausea or vomiting. °· You have a seizure. °· You have trouble moving any part of your body. °· You develop numbness in any part of your body. °· You have trouble speaking. °· You have any abnormal bleeding. °· You pass out. °This information is not intended to replace advice given to you by your health care provider. Make sure you discuss any questions you have with your health  care provider. °Document Released: 08/15/2000 Document Revised: 04/15/2016 Document Reviewed: 03/24/2016 °Elsevier Interactive Patient Education © 2018 [employer] ° °

## 2017-02-17 NOTE — Discharge Summary (Signed)
Di-di twins at 7554w6d Elevated blood pressure in 3rd trimester High risk pregnancy in 3rd trimester Proteinuria in 3rd trimester Thrombocytopenia in 3rd trimester  Here only to receive 2nd dose of betamethasone.  Administered.  I did not have interaction with the patient; was just notified of her presentation.  ----- Ranae Plumberhelsea Aviah Sorci, MD Attending Obstetrician and Gynecologist [facility], Department of OB/GYN [facility]

## 2017-02-19 ENCOUNTER — Other Ambulatory Visit: Payer: Managed Care, Other (non HMO)

## 2017-02-19 ENCOUNTER — Inpatient Hospital Stay
Admission: RE | Admit: 2017-02-19 | Discharge: 2017-02-19 | Disposition: A | Discharge status: Home / Self Care | Payer: Managed Care, Other (non HMO) | Attending: Obstetrics and Gynecology | Admitting: Obstetrics and Gynecology

## 2017-02-19 ENCOUNTER — Encounter: Payer: Self-pay | Admitting: *Deleted

## 2017-02-19 DIAGNOSIS — Z86711 Personal history of pulmonary embolism: Secondary | ICD-10-CM | POA: Diagnosis not present

## 2017-02-19 DIAGNOSIS — Z3A33 33 weeks gestation of pregnancy: Secondary | ICD-10-CM | POA: Diagnosis not present

## 2017-02-19 DIAGNOSIS — E669 Obesity, unspecified: Secondary | ICD-10-CM | POA: Insufficient documentation

## 2017-02-19 DIAGNOSIS — O09893 Supervision of other high risk pregnancies, third trimester: Secondary | ICD-10-CM | POA: Diagnosis present

## 2017-02-19 DIAGNOSIS — R000 Tachycardia, unspecified: Secondary | ICD-10-CM

## 2017-02-19 DIAGNOSIS — O99213 Obesity complicating pregnancy, third trimester: Secondary | ICD-10-CM | POA: Insufficient documentation

## 2017-02-19 DIAGNOSIS — O30003 Twin pregnancy, unspecified number of placenta and unspecified number of amniotic sacs, third trimester: Secondary | ICD-10-CM | POA: Insufficient documentation

## 2017-02-19 DIAGNOSIS — O1493 Unspecified pre-eclampsia, third trimester: Secondary | ICD-10-CM | POA: Diagnosis present

## 2017-02-19 DIAGNOSIS — Z87440 Personal history of urinary (tract) infections: Secondary | ICD-10-CM | POA: Diagnosis not present

## 2017-02-19 DIAGNOSIS — F329 Major depressive disorder, single episode, unspecified: Secondary | ICD-10-CM | POA: Insufficient documentation

## 2017-02-19 DIAGNOSIS — O1403 Mild to moderate pre-eclampsia, third trimester: Secondary | ICD-10-CM | POA: Insufficient documentation

## 2017-02-19 DIAGNOSIS — Z7982 Long term (current) use of aspirin: Secondary | ICD-10-CM | POA: Diagnosis not present

## 2017-02-19 DIAGNOSIS — O99343 Other mental disorders complicating pregnancy, third trimester: Secondary | ICD-10-CM | POA: Insufficient documentation

## 2017-02-19 DIAGNOSIS — I951 Orthostatic hypotension: Secondary | ICD-10-CM

## 2017-02-19 DIAGNOSIS — G90A Postural orthostatic tachycardia syndrome (POTS): Secondary | ICD-10-CM

## 2017-02-19 LAB — COMPREHENSIVE METABOLIC PANEL
ALBUMIN: 2.9 g/dL — AB (ref 3.5–5.0)
ALK PHOS: 147 U/L — AB (ref 38–126)
ALT: 22 U/L (ref 14–54)
ANION GAP: 7 (ref 5–15)
AST: 29 U/L (ref 15–41)
BUN: 6 mg/dL (ref 6–20)
CALCIUM: 8.7 mg/dL — AB (ref 8.9–10.3)
CO2: 19 mmol/L — AB (ref 22–32)
Chloride: 108 mmol/L (ref 101–111)
Creatinine, Ser: 0.54 mg/dL (ref 0.44–1.00)
GFR calc Af Amer: >60 mL/min (ref >60)
GFR calc non Af Amer: >60 mL/min (ref >60)
GLUCOSE: 86 mg/dL (ref 65–99)
Potassium: 3.9 mmol/L (ref 3.5–5.1)
SODIUM: 134 mmol/L — AB (ref 135–145)
Total Bilirubin: 1.2 mg/dL (ref 0.3–1.2)
Total Protein: 6.8 g/dL (ref 6.5–8.1)

## 2017-02-19 LAB — PROTEIN / CREATININE RATIO, URINE
CREATININE, URINE: 236 mg/dL
Protein Creatinine Ratio: 0.27 mg/mg{Cre} — ABNORMAL HIGH (ref 0.00–0.15)
Total Protein, Urine: 63 mg/dL

## 2017-02-19 LAB — CBC
HEMATOCRIT: 38.2 % (ref 35.0–47.0)
HEMOGLOBIN: 13.2 g/dL (ref 12.0–16.0)
MCH: 29.8 pg (ref 26.0–34.0)
MCHC: 34.6 g/dL (ref 32.0–36.0)
MCV: 86.1 fL (ref 80.0–100.0)
Platelets: 145 10*3/uL — ABNORMAL LOW (ref 150–440)
RBC: 4.43 MIL/uL (ref 3.80–5.20)
RDW: 15.4 % — AB (ref 11.5–14.5)
WBC: 9.6 10*3/uL (ref 3.6–11.0)

## 2017-02-19 NOTE — OB Triage Note (Signed)
Scheduled NST, PIH/twins

## 2017-02-19 NOTE — Discharge Summary (Signed)
Schermerhorn, Gwen Her, MD  Obstetrics    _0 Hide copied text _1 Hover for attribution information Patient ID: Debra Terry, female   DOB: 04-12-1986, 31 y.o.   MRN: [id number]  Debra Terry is a 31 y.o. female. She is at 81w5dgestation. Patient's last menstrual period was 06/03/2016. twin gestation and f/up for preeclamsia . No h/a . No vision change  Estimated Date of Delivery: 04/04/17  [facility] Chief complaint:  Location: Onset/timing: Duration: Quality:  Severity: Aggravating or alleviating conditions: Associated signs/symptoms: Context:  S: Resting comfortably. noCTX, no VB.no LOF, Active fetal movement  Maternal Medical History:       Past Medical History:  Diagnosis Date  . Allergic rhinitis   . Complication of anesthesia    woke during T&A  . Depression    lost child 08/2011  . Generalized headaches    frequent  . H/O: menorrhagia    to start depo for this  . 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 History of syncope    neurocardiogenic per pt  . Hx: UTI (urinary tract infection)   . Lupus anticoagulant positive   . Migraine headache    [facility]  . Motion sickness    all moving vehicles  . Obesity    lost >100 lbs, now gained back some, previously ran 2 mi in am 1 in pm  . Seasonal allergies   . Wears contact lenses          Past Surgical History:  Procedure Laterality Date  . CESAREAN SECTION    . COLONOSCOPY WITH PROPOFOL N/A 05/16/2015   Procedure: COLONOSCOPY WITH PROPOFOL;  Surgeon: DLucilla Lame MD;  [place]  Service: Endoscopy;  Laterality: N/A;  . EXPLORATORY LAPAROTOMY  2006   Excessive vaginal bleeding  . EYE SURGERY  1999  . TONSILLECTOMY AND ADENOIDECTOMY  1998         Allergies  Allergen Reactions  . Warfarin Sodium      H/o GI bleed.             Prior to Admission medications   Medication Sig Start Date End Date Taking? Authorizing Provider  aspirin EC 81 MG tablet Take 81 mg by mouth daily.   Yes [provider]  folic acid (FOLVITE) 1 MG tablet Take 1 mg by mouth daily.   Yes [provider]  metoprolol tartrate (LOPRESSOR) 50 MG tablet Take 50 mg by mouth 2 (two) times daily.   Yes [provider]  Prenatal Vit-Fe Fumarate-FA (MULTIVITAMIN-PRENATAL) 27-0.8 MG TABS tablet Take 1 tablet by mouth daily at 12 noon.   Yes [provider]  sertraline (ZOLOFT) 100 MG tablet Take 200 mg by mouth daily.    Yes [provider]     Social History: She  reports that she has never smoked. She has never used smokeless tobacco. She reports that she does not drink alcohol or use drugs.  Family History: family history includes Alcohol abuse in her maternal grandfather and paternal grandfather; Arthritis in her maternal grandfather and paternal grandfather; Breast cancer in her paternal grandmother; Colitis in her father; Coronary artery disease (age of onset: 631 in her paternal grandfather; Diabetes in her father; Heart disease in her maternal grandfather, maternal grandmother, paternal grandfather, and paternal grandmother; Hyperlipidemia in her father; Hypertension in her  father, maternal grandmother, mother, and paternal grandmother; Lung cancer in her maternal grandfather.  no history of gyn cancers  Review of Systems: A full review of systems was performed and negative except as noted in the HPI.   pulm : no asthma   Gyn : no STD  Gu : no recurrent UTI  O:inital BP 160/104  BP (!) 138/97   Pulse 98   Ht _0  (1.803 m)   Wt (!) 139.7 kg (308 lb)   LMP 06/03/2016   BMI 42.96 kg/m       Results for orders placed or performed during the hospital encounter of 02/19/17 (from the past 48 hour(s))  Comprehensive metabolic panel   Collection Time:  02/19/17 12:04 PM  Result Value Ref Range   Sodium 134 (L) 135 - 145 mmol/L   Potassium 3.9 3.5 - 5.1 mmol/L   Chloride 108 101 - 111 mmol/L   CO2 19 (L) 22 - 32 mmol/L   Glucose, Bld 86 65 - 99 mg/dL   BUN 6 6 - 20 mg/dL   Creatinine, Ser 0.54 0.44 - 1.00 mg/dL   Calcium 8.7 (L) 8.9 - 10.3 mg/dL   Total Protein 6.8 6.5 - 8.1 g/dL   Albumin 2.9 (L) 3.5 - 5.0 g/dL   AST 29 15 - 41 U/L   ALT 22 14 - 54 U/L   Alkaline Phosphatase 147 (H) 38 - 126 U/L   Total Bilirubin 1.2 0.3 - 1.2 mg/dL   GFR calc non Af Amer >60 >60 mL/min   GFR calc Af Amer >60 >60 mL/min   Anion gap 7 5 - 15  CBC   Collection Time: 02/19/17 12:04 PM  Result Value Ref Range   WBC 9.6 3.6 - 11.0 K/uL   RBC 4.43 3.80 - 5.20 MIL/uL   Hemoglobin 13.2 12.0 - 16.0 g/dL   HCT 38.2 35.0 - 47.0 %   MCV 86.1 80.0 - 100.0 fL   MCH 29.8 26.0 - 34.0 pg   MCHC 34.6 32.0 - 36.0 g/dL   RDW 15.4 (H) 11.5 - 14.5 %   Platelets 145 (L) 150 - 440 K/uL  Protein / creatinine ratio, urine   Collection Time: 02/19/17 12:13 PM  Result Value Ref Range   Creatinine, Urine 236 mg/dL   Total Protein, Urine 63 mg/dL   Protein Creatinine Ratio 0.27 (H) 0.00 - 0.15 mg/mg[Cre]   Constitutional: NAD, AAOx3  HE/ENT: extraocular movements grossly intact, moist mucous membranes CV: RRR PULM: nl respiratory effort, CTABL                                         Abd: gravid, non-tender, non-distended, soft                                                  Ext: Non-tender, Nonedmeatous                     Psych: mood appropriate, speech normal Pelvic deferred  NST TWins Baseline:Baby A; 120-130 reactive + accels no decels   Baby B : FHR 130 -140 + accels , no decels     A/P: 31 y.o. 87w5dhere for antenatal surveillance for twins and preeclampsia . BP elvated but  not consisently in severe . P/C ratio now normal     Fetal Wellbeing x2: Reassuring Cat 1 tracing.  Reactive NST   D/c home stable,  precautions reviewed, follow-up as scheduled.  Cont NST 2x/ week if severe criteria is met than will admit   ----- Cindi Carbon  MD Attending Obstetrician and Gynecologist [facility], Department of [facility]     Electronically signed by Schermerhorn, Gwen Her, MD at 02/19/2017 2:22 PM      Admission (Current) on 02/19/2017

## 2017-02-19 NOTE — Progress Notes (Signed)
Patient ID: Rejeana Brock, female   DOB: 02-Mar-1986, 31 y.o.   MRN: [id number]  JULETTA BERHE is a 31 y.o. female. She is at 45w5dgestation. Patient's last menstrual period was 06/03/2016. twin gestation and f/up for preeclamsia . No h/a . No vision change  Estimated Date of Delivery: 04/04/17  [facility] Chief complaint:  Location: Onset/timing: Duration: Quality:  Severity: Aggravating or alleviating conditions: Associated signs/symptoms: Context:  S: Resting comfortably. no CTX, no VB.no LOF,  Active fetal movement  Maternal Medical History:   Past Medical History:  Diagnosis Date  . Allergic rhinitis   . Complication of anesthesia    woke during T&A  . Depression    lost child 08/2011  . Generalized headaches    frequent  . H/O: menorrhagia    to start depo for this  . 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 History of syncope    neurocardiogenic per pt  . Hx: UTI (urinary tract infection)   . Lupus anticoagulant positive   . Migraine headache    [facility]  . Motion sickness    all moving vehicles  . Obesity    lost >100 lbs, now gained back some, previously ran 2 mi in am 1 in pm  . Seasonal allergies   . Wears contact lenses     Past Surgical History:  Procedure Laterality Date  . CESAREAN SECTION    . COLONOSCOPY WITH PROPOFOL N/A 05/16/2015   Procedure: COLONOSCOPY WITH PROPOFOL;  Surgeon: DLucilla Lame MD;  [place]  Service: Endoscopy;  Laterality: N/A;  . EXPLORATORY LAPAROTOMY  2006   Excessive vaginal bleeding  . EYE SURGERY  1999  . TONSILLECTOMY AND ADENOIDECTOMY  1998    Allergies  Allergen Reactions  . Warfarin Sodium     H/o GI bleed.      Prior to Admission medications   Medication Sig Start Date End Date Taking? Authorizing Provider  aspirin EC 81 MG tablet Take 81 mg by  mouth daily.   Yes [provider]  folic acid (FOLVITE) 1 MG tablet Take 1 mg by mouth daily.   Yes [provider]  metoprolol tartrate (LOPRESSOR) 50 MG tablet Take 50 mg by mouth 2 (two) times daily.   Yes [provider]  Prenatal Vit-Fe Fumarate-FA (MULTIVITAMIN-PRENATAL) 27-0.8 MG TABS tablet Take 1 tablet by mouth daily at 12 noon.   Yes [provider]  sertraline (ZOLOFT) 100 MG tablet Take 200 mg by mouth daily.    Yes [provider]     Social History: She  reports that she has never smoked. She has never used smokeless tobacco. She reports that she does not drink alcohol or use drugs.  Family History: family history includes Alcohol abuse in her maternal grandfather and paternal grandfather; Arthritis in her maternal grandfather and paternal grandfather; Breast cancer in her paternal grandmother; Colitis in her father; Coronary artery disease (age of onset: 630 in her paternal grandfather; Diabetes in her father; Heart disease in her maternal grandfather, maternal grandmother, paternal grandfather, and paternal grandmother; Hyperlipidemia in her father; Hypertension in her father, maternal grandmother, mother, and paternal grandmother; Lung cancer in her maternal grandfather.  no history of gyn cancers  Review of Systems: A full review of systems was performed and negative except as noted  in the HPI.   pulm : no asthma   Gyn : no STD  Gu : no recurrent UTI  O:inital BP 160/104  BP (!) 138/97   Pulse 98   Ht _0  (1.803 m)   Wt (!) 139.7 kg (308 lb)   LMP 06/03/2016   BMI 42.96 kg/m  Results for orders placed or performed during the hospital encounter of 02/19/17 (from the past 48 hour(s))  Comprehensive metabolic panel   Collection Time: 02/19/17 12:04 PM  Result Value Ref Range   Sodium 134 (L) 135 - 145 mmol/L   Potassium 3.9 3.5 - 5.1 mmol/L   Chloride 108 101 - 111 mmol/L   CO2 19 (L) 22 - 32 mmol/L   Glucose, Bld 86 65  - 99 mg/dL   BUN 6 6 - 20 mg/dL   Creatinine, Ser 0.54 0.44 - 1.00 mg/dL   Calcium 8.7 (L) 8.9 - 10.3 mg/dL   Total Protein 6.8 6.5 - 8.1 g/dL   Albumin 2.9 (L) 3.5 - 5.0 g/dL   AST 29 15 - 41 U/L   ALT 22 14 - 54 U/L   Alkaline Phosphatase 147 (H) 38 - 126 U/L   Total Bilirubin 1.2 0.3 - 1.2 mg/dL   GFR calc non Af Amer >60 >60 mL/min   GFR calc Af Amer >60 >60 mL/min   Anion gap 7 5 - 15  CBC   Collection Time: 02/19/17 12:04 PM  Result Value Ref Range   WBC 9.6 3.6 - 11.0 K/uL   RBC 4.43 3.80 - 5.20 MIL/uL   Hemoglobin 13.2 12.0 - 16.0 g/dL   HCT 38.2 35.0 - 47.0 %   MCV 86.1 80.0 - 100.0 fL   MCH 29.8 26.0 - 34.0 pg   MCHC 34.6 32.0 - 36.0 g/dL   RDW 15.4 (H) 11.5 - 14.5 %   Platelets 145 (L) 150 - 440 K/uL  Protein / creatinine ratio, urine   Collection Time: 02/19/17 12:13 PM  Result Value Ref Range   Creatinine, Urine 236 mg/dL   Total Protein, Urine 63 mg/dL   Protein Creatinine Ratio 0.27 (H) 0.00 - 0.15 mg/mg[Cre]     Constitutional: NAD, AAOx3  HE/ENT: extraocular movements grossly intact, moist mucous membranes CV: RRR PULM: nl respiratory effort, CTABL     Abd: gravid, non-tender, non-distended, soft      Ext: Non-tender, Nonedmeatous   Psych: mood appropriate, speech normal Pelvic deferred  NST TWins Baseline:Baby A; 120-130 reactive + accels no decels   Baby B : FHR 130 -140 + accels , no decels     A/P: 31 y.o. 64w5dhere for antenatal surveillance for twins and preeclampsia . BP elvated but not consisently in severe . P/C ratio now normal     Fetal Wellbeing x2: Reassuring Cat 1 tracing.  Reactive NST   D/c home stable, precautions reviewed, follow-up as scheduled.  Cont NST 2x/ week if severe criteria is met than will admit   ----- TCindi Carbon MD Attending Obstetrician and Gynecologist [facility]

## 2017-02-24 ENCOUNTER — Observation Stay
Admission: RE | Admit: 2017-02-24 | Discharge: 2017-02-24 | Disposition: A | Discharge status: Home / Self Care | Payer: Managed Care, Other (non HMO) | Attending: Obstetrics & Gynecology | Admitting: Obstetrics & Gynecology

## 2017-02-24 DIAGNOSIS — O0993 Supervision of high risk pregnancy, unspecified, third trimester: Secondary | ICD-10-CM | POA: Diagnosis not present

## 2017-02-24 DIAGNOSIS — Z86711 Personal history of pulmonary embolism: Secondary | ICD-10-CM | POA: Insufficient documentation

## 2017-02-24 DIAGNOSIS — O30003 Twin pregnancy, unspecified number of placenta and unspecified number of amniotic sacs, third trimester: Secondary | ICD-10-CM | POA: Diagnosis not present

## 2017-02-24 DIAGNOSIS — D6862 Lupus anticoagulant syndrome: Secondary | ICD-10-CM | POA: Diagnosis not present

## 2017-02-24 DIAGNOSIS — Z7982 Long term (current) use of aspirin: Secondary | ICD-10-CM | POA: Insufficient documentation

## 2017-02-24 DIAGNOSIS — G90A Postural orthostatic tachycardia syndrome (POTS): Secondary | ICD-10-CM

## 2017-02-24 DIAGNOSIS — O99112 Other diseases of the blood and blood-forming organs and certain disorders involving the immune mechanism complicating pregnancy, second trimester: Secondary | ICD-10-CM | POA: Insufficient documentation

## 2017-02-24 DIAGNOSIS — I951 Orthostatic hypotension: Secondary | ICD-10-CM

## 2017-02-24 DIAGNOSIS — Z79899 Other long term (current) drug therapy: Secondary | ICD-10-CM | POA: Insufficient documentation

## 2017-02-24 DIAGNOSIS — Z3A34 34 weeks gestation of pregnancy: Secondary | ICD-10-CM | POA: Insufficient documentation

## 2017-02-24 DIAGNOSIS — R000 Tachycardia, unspecified: Secondary | ICD-10-CM

## 2017-02-24 NOTE — Consult Note (Signed)
Patient seen at request of OB/GYN due to hx of obesity and POTS syndrome. Currently pregnant with twins. Hx of prior c-section for fetal intolerance of labor in setting of pregnancy complicated by preeclampsia. She has not had any problems with elevated BP during this pregnancy. Planning for repeat cesarean delivery. Not on any anticoagulants, no history of bleeding disorders.   Airway exam reassuring, MP2, normal mouth opening, TM distance >3cm, normal neck flexion/extension. No evidence of spinal abnormalities on exam.  [facility] [address] [facility]. Plan for spinal anesthesia for repeat c-section, currently scheduled for 03/16/17.

## 2017-02-24 NOTE — OB Triage Note (Signed)
[name] here for scheduled NST, denies headache, bleeding, ctx, reports positive fetal movement

## 2017-02-24 NOTE — Discharge Summary (Signed)
Debra Terry is a 31 y.o. female. She is at 1284w3d gestation. Patient's last menstrual period was 06/03/2016. Estimated Date of Delivery: 04/04/17   [facility]   Chief Complaint: high risk pregnancy, need for antepartum surveillance  S: Resting comfortably. no CTX, no VB.no LOF,  Active fetal movement.    Maternal Medical History:   Past Medical History:  Diagnosis Date  . Allergic rhinitis   . Complication of anesthesia    woke during T&A  . Depression    lost child 08/2011  . Generalized headaches    frequent  . H/O: menorrhagia    to start depo for this  . 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 History of syncope    neurocardiogenic per pt  . Hx: UTI (urinary tract infection)   . Lupus anticoagulant positive   . Migraine headache    [facility]  . Motion sickness    all moving vehicles  . Obesity    lost >100 lbs, now gained back some, previously ran 2 mi in am 1 in pm  . Seasonal allergies   . Wears contact lenses     Past Surgical History:  Procedure Laterality Date  . CESAREAN SECTION    . COLONOSCOPY WITH PROPOFOL N/A 05/16/2015   Procedure: COLONOSCOPY WITH PROPOFOL;  Surgeon: Midge Miniumarren Wohl, MD;  [facility];  Service: Endoscopy;  Laterality: N/A;  . EXPLORATORY LAPAROTOMY  2006   Excessive vaginal bleeding  . EYE SURGERY  1999  . TONSILLECTOMY AND ADENOIDECTOMY  1998    Allergies  Allergen Reactions  . Warfarin Sodium     H/o GI bleed.      Prior to Admission medications   Medication Sig Start Date End Date Taking? Authorizing Provider  aspirin EC 81 MG tablet Take 81 mg by mouth daily.    [provider]  folic acid (FOLVITE) 1 MG tablet Take 1 mg by mouth daily.    [provider]  metoprolol tartrate (LOPRESSOR) 50 MG tablet Take 50 mg by mouth 2 (two) times daily.     [provider]  Prenatal Vit-Fe Fumarate-FA (MULTIVITAMIN-PRENATAL) 27-0.8 MG TABS tablet Take 1 tablet by mouth daily at 12 noon.    [provider]  sertraline (ZOLOFT) 100 MG tablet Take 200 mg by mouth daily.     [provider]     Social History: She  reports that she has never smoked. She has never used smokeless tobacco. She reports that she does not drink alcohol or use drugs.  Family History: family history includes Alcohol abuse in her maternal grandfather and paternal grandfather; Arthritis in her maternal grandfather and paternal grandfather; Breast cancer in her paternal grandmother; Colitis in her father; Coronary artery disease (age of onset: 6560) in her paternal grandfather; Diabetes in her father; Heart disease in her maternal grandfather, maternal grandmother, paternal grandfather, and paternal grandmother; Hyperlipidemia in her father; Hypertension in her father, maternal grandmother, mother, and paternal grandmother; Lung cancer in her maternal grandfather.    Review of Systems: A full review of systems was performed and negative except as noted in the HPI.     O:  BP 123/72 (BP Location: Left Arm)   Pulse (!) 102   Temp 98.1 F (36.7 C) (Oral)   Resp 18   LMP 06/03/2016  No results found for this or any previous visit (from the past 48 hour(s)).   Constitutional: NAD, AAOx3  HE/ENT: extraocular movements grossly intact, moist mucous membranes CV: RRR PULM: nl respiratory effort, CTABL     Abd: gravid, non-tender, non-distended, soft      Ext: Non-tender, Nonedmeatous   Psych: mood appropriate, speech normal Pelvic:deferred  Baby A Baseline: 125 Variability: moderate Accelerations present x >2 Decelerations absent Time  Baby B Baseline: 130 Variability: moderate Accelerations present x >2 Decelerations absent Time    A/P: 31 y.o. [redacted]w[redacted]d with high risk pregnancy and antepartum surveillance.   Labor: not  present.   Fetal Wellbeing: Reassuring Cat 1 tracing.  Reactive NST x 2  D/c home stable, precautions reviewed, follow-up as scheduled.   ----- Ranae Plumber, MD Attending Obstetrician and Gynecologist [facility], Department of OB/GYN [facility]

## 2017-02-24 NOTE — Progress Notes (Signed)
Difficulty tracing 2 independent FHRs, multiple attempts to adjust U/S on both babies, Dr. Elesa MassedWard notified to come for assist with bedside U/S

## 2017-02-27 ENCOUNTER — Observation Stay
Admission: RE | Admit: 2017-02-27 | Discharge: 2017-02-27 | Disposition: A | Discharge status: Home / Self Care | Payer: Managed Care, Other (non HMO) | Attending: Obstetrics & Gynecology | Admitting: Obstetrics & Gynecology

## 2017-02-27 DIAGNOSIS — Z86711 Personal history of pulmonary embolism: Secondary | ICD-10-CM | POA: Insufficient documentation

## 2017-02-27 DIAGNOSIS — O1493 Unspecified pre-eclampsia, third trimester: Secondary | ICD-10-CM | POA: Diagnosis not present

## 2017-02-27 DIAGNOSIS — R000 Tachycardia, unspecified: Secondary | ICD-10-CM

## 2017-02-27 DIAGNOSIS — Z7982 Long term (current) use of aspirin: Secondary | ICD-10-CM | POA: Diagnosis not present

## 2017-02-27 DIAGNOSIS — O30043 Twin pregnancy, dichorionic/diamniotic, third trimester: Principal | ICD-10-CM | POA: Insufficient documentation

## 2017-02-27 DIAGNOSIS — Z79899 Other long term (current) drug therapy: Secondary | ICD-10-CM | POA: Insufficient documentation

## 2017-02-27 DIAGNOSIS — I951 Orthostatic hypotension: Secondary | ICD-10-CM

## 2017-02-27 DIAGNOSIS — G90A Postural orthostatic tachycardia syndrome (POTS): Secondary | ICD-10-CM

## 2017-02-27 DIAGNOSIS — Z3A34 34 weeks gestation of pregnancy: Secondary | ICD-10-CM | POA: Insufficient documentation

## 2017-02-27 DIAGNOSIS — O0993 Supervision of high risk pregnancy, unspecified, third trimester: Secondary | ICD-10-CM

## 2017-02-27 NOTE — Discharge Summary (Signed)
Debra Terry is a 31 y.o. female. She is at [redacted]w[redacted]d gestation. Patient's last menstrual period was 06/03/2016. Estimated Date of Delivery: 04/04/17   Prenatal care site: [facility]   Chief Complaint: high risk pregnancy, need for antepartum surveillance, di-di twins, preeclampsia  S: Resting comfortably. no CTX, no VB.no LOF,  Active fetal movement.  Denies: HA, visual changes, SOB, or RUQ/epigastric pain   Maternal Medical History:   Past Medical History:  Diagnosis Date  . Allergic rhinitis   . Complication of anesthesia    woke during T&A  . Depression    lost child 08/2011  . Generalized headaches    frequent  . H/O: menorrhagia    to start depo for this  . 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 History of syncope    neurocardiogenic per pt  . Hx: UTI (urinary tract infection)   . Lupus anticoagulant positive   . Migraine headache    h/o admission at [facility] for this  . Motion sickness    all moving vehicles  . Obesity    lost >100 lbs, now gained back some, previously ran 2 mi in am 1 in pm  . Seasonal allergies   . Wears contact lenses     Past Surgical History:  Procedure Laterality Date  . CESAREAN SECTION    . COLONOSCOPY WITH PROPOFOL N/A 05/16/2015   Procedure: COLONOSCOPY WITH PROPOFOL;  Surgeon: Midge Minium, MD;  Location: [facility];  Service: Endoscopy;  Laterality: N/A;  . EXPLORATORY LAPAROTOMY  2006   Excessive vaginal bleeding  . EYE SURGERY  1999  . TONSILLECTOMY AND ADENOIDECTOMY  1998    Allergies  Allergen Reactions  . Warfarin Sodium     H/o GI bleed.      Prior to Admission medications   Medication Sig Start Date End Date Taking? Authorizing Provider  aspirin EC 81 MG tablet Take 81 mg by mouth daily.   Yes [provider]  folic acid (FOLVITE) 1 MG tablet Take 1 mg by mouth daily.   Yes [provider]   metoprolol tartrate (LOPRESSOR) 50 MG tablet Take 50 mg by mouth 2 (two) times daily.   Yes [provider]  Prenatal Vit-Fe Fumarate-FA (MULTIVITAMIN-PRENATAL) 27-0.8 MG TABS tablet Take 1 tablet by mouth daily at 12 noon.   Yes [provider]  sertraline (ZOLOFT) 100 MG tablet Take 200 mg by mouth daily.    Yes [provider]     Social History: She  reports that she has never smoked. She has never used smokeless tobacco. She reports that she does not drink alcohol or use drugs.  Family History: family history includes Alcohol abuse in her maternal grandfather and paternal grandfather; Arthritis in her maternal grandfather and paternal grandfather; Breast cancer in her paternal grandmother; Colitis in her father; Coronary artery disease (age of onset: 5) in her paternal grandfather; Diabetes in her father; Heart disease in her maternal grandfather, maternal grandmother, paternal grandfather, and paternal grandmother; Hyperlipidemia in her father; Hypertension in her father, maternal grandmother, mother, and paternal grandmother; Lung cancer in her maternal grandfather.  Review of Systems: A full review of systems was performed and negative except as noted in the HPI.     O:  BP (!) 141/96   Pulse (!) 114   Temp 98.6 F (37 C) (Oral)   Resp  18   LMP 06/03/2016  No results found for this or any previous visit (from the past 48 hour(s)).   Constitutional: NAD, AAOx3  HE/ENT: extraocular movements grossly intact, moist mucous membranes CV: RRR PULM: nl respiratory effort, CTABL     Abd: gravid, non-tender, non-distended, soft      Ext: Non-tender, Nonedmeatous   Psych: mood appropriate, speech normal Pelvic:deferred  BABY A: Baseline:  145 Variability: moderate Accelerations present x >2 Decelerations absent Time 20mins  BABY B: Baseline:  145 Variability: moderate Accelerations present x >2 Decelerations absent Time 20mins   A/P: 31 y.o.  6390w6d with high risk pregnancy and antepartum surveillance.   Labor: not present.   Fetal Wellbeing: Reassuring Cat 1 tracing.  Reactive NST  X 2  No worsening of preeclampsia  D/c home stable, precautions reviewed, follow-up as scheduled.   ----- Ranae Plumberhelsea Wendell Fiebig, MD Attending Obstetrician and Gynecologist [facility], Department of OB/GYN [facility]

## 2017-02-27 NOTE — OB Triage Note (Signed)
[name] here for scheduled NST for multiple gestation, denies bleeding, LOF, reports positive fetal movement

## 2017-03-02 NOTE — H&P (Signed)
OB History & Physical   History of Present Illness:  Chief Complaint:   HPI:  Roselie Awkwardlyson L Grenz is a 31 y.o. 853P1011 female at 5129w2d dated by ultrasound at 10 weeks  Estimated Date of Delivery: 04/04/17, LMP 06/02/16  She presents to L&D for scheduled cesarean and BTL  +FM, no CTX, no LOF, no VB  Pregnancy Issues: 1. Morbid obesity BMI 43 2. Di-Di twins, concordant growth 3. Preeclampsia dx'd at 33wks 4. RH negative, s/p rhogam @ 28 wks 5. 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 POTS - on metoprolol and sertraline 7. Desired permanent sterilization 8. Thrombocytopenia  Maternal Medical History:   Past Medical History:  Diagnosis Date  . Allergic rhinitis   . Complication of anesthesia    woke during T&A  . Depression    lost child 08/2011  . Generalized headaches    frequent  . H/O: menorrhagia    to start depo for this  . 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 History of syncope    neurocardiogenic per pt  . Hx: UTI (urinary tract infection)   . Lupus anticoagulant positive   . Migraine headache    [facility]  . Motion sickness    all moving vehicles  . Obesity    lost >100 lbs, now gained back some, previously ran 2 mi in am 1 in pm  . Seasonal allergies   . Wears contact lenses     Past Surgical History:  Procedure Laterality Date  . CESAREAN SECTION    . COLONOSCOPY WITH PROPOFOL N/A 05/16/2015   Procedure: COLONOSCOPY WITH PROPOFOL;  Surgeon: Midge Miniumarren Wohl, MD;  [facility];  Service: Endoscopy;  Laterality: N/A;  . EXPLORATORY LAPAROTOMY  2006   Excessive vaginal bleeding  . EYE SURGERY  1999  . TONSILLECTOMY AND ADENOIDECTOMY  1998    Allergies  Allergen Reactions  . Warfarin Sodium     H/o GI bleed.      Prior to Admission medications   Medication Sig Start Date End Date Taking? Authorizing Provider  aspirin EC 81 MG tablet Take 81  mg by mouth daily.    [provider]  folic acid (FOLVITE) 1 MG tablet Take 1 mg by mouth daily.    [provider]  metoprolol tartrate (LOPRESSOR) 50 MG tablet Take 50 mg by mouth 2 (two) times daily.    [provider]  Prenatal Vit-Fe Fumarate-FA (MULTIVITAMIN-PRENATAL) 27-0.8 MG TABS tablet Take 1 tablet by mouth daily at 12 noon.    [provider]  sertraline (ZOLOFT) 100 MG tablet Take 200 mg by mouth daily.     [provider]     [facility]     Social History: She  reports that she has never smoked. She has never used smokeless tobacco. She reports that she does not drink alcohol or use drugs.  Family History: family history includes Alcohol abuse in her maternal grandfather and paternal grandfather; Arthritis in her maternal grandfather and paternal grandfather; Breast cancer in her paternal grandmother; Colitis in her father; Coronary artery disease (age of onset: 3660) in her paternal grandfather; Diabetes in her father; Heart disease in her maternal grandfather, maternal grandmother, paternal grandfather, and paternal grandmother; Hyperlipidemia in her father; Hypertension in her father, maternal grandmother, mother, and paternal grandmother; Lung cancer in  her maternal grandfather.   Review of Systems: A full review of systems was performed and negative except as noted in the HPI.     Physical Exam:  Vital Signs: LMP 06/03/2016  General: no acute distress.  HEENT: normocephalic, atraumatic Heart: regular rate & rhythm.  No murmurs/rubs/gallops Lungs: clear to auscultation bilaterally, normal respiratory effort Abdomen: soft, gravid, non-tender;  EFW: A: 7lb, B 7lb Pelvic:   External: Normal external female genitalia   Extremities: non-tender, symmetric, 2+ edema bilaterally.  DTRs: 2+  Neurologic: Alert & oriented x 3.    No results found for this or any previous visit (from the past 24  hour(s)).  Pertinent Results:  Prenatal Labs: Blood type/Rh O neg  Antibody screen neg  Rubella Immune  Varicella Immune  RPR NR  HBsAg Neg  HIV NR  GC neg  Chlamydia neg  Genetic screening negative  1 hour GTT Early: 137, 28wk 117  3 hour GTT [phone]  GBS pending      No results found.  Assessment:  CHANNING SAVICH is a 31 y.o. G40P1011 female at [redacted]w[redacted]d with Preeclampsia, Di-Di twins, for scheduled repeat CS and BTL.   Plan:  1. Admit to Labor & Delivery 2. CBC, T&S, NPO, IVF, ABX 3. GBS  pending 4. Consents obtained. 5. Continuous efm/toco 6. To OR when ready  ----- Ranae Plumber, MD Attending Obstetrician and Gynecologist [facility], Department of OB/GYN [facility]

## 2017-03-06 ENCOUNTER — Observation Stay
Admission: RE | Admit: 2017-03-06 | Discharge: 2017-03-06 | Disposition: A | Discharge status: Home / Self Care | Payer: Managed Care, Other (non HMO) | Attending: Obstetrics & Gynecology | Admitting: Obstetrics & Gynecology

## 2017-03-06 DIAGNOSIS — G90A Postural orthostatic tachycardia syndrome (POTS): Secondary | ICD-10-CM

## 2017-03-06 DIAGNOSIS — Z7982 Long term (current) use of aspirin: Secondary | ICD-10-CM | POA: Diagnosis not present

## 2017-03-06 DIAGNOSIS — E669 Obesity, unspecified: Secondary | ICD-10-CM | POA: Insufficient documentation

## 2017-03-06 DIAGNOSIS — Z86711 Personal history of pulmonary embolism: Secondary | ICD-10-CM | POA: Insufficient documentation

## 2017-03-06 DIAGNOSIS — O1493 Unspecified pre-eclampsia, third trimester: Secondary | ICD-10-CM | POA: Diagnosis present

## 2017-03-06 DIAGNOSIS — Z79899 Other long term (current) drug therapy: Secondary | ICD-10-CM | POA: Insufficient documentation

## 2017-03-06 DIAGNOSIS — F329 Major depressive disorder, single episode, unspecified: Secondary | ICD-10-CM | POA: Insufficient documentation

## 2017-03-06 DIAGNOSIS — R000 Tachycardia, unspecified: Secondary | ICD-10-CM

## 2017-03-06 DIAGNOSIS — Z3A35 35 weeks gestation of pregnancy: Secondary | ICD-10-CM | POA: Insufficient documentation

## 2017-03-06 DIAGNOSIS — I951 Orthostatic hypotension: Secondary | ICD-10-CM

## 2017-03-06 NOTE — OB Triage Note (Signed)
Patient here for NST , no complaints.

## 2017-03-06 NOTE — Discharge Summary (Signed)
Debra Terry is a 31 y.o. female. She is at 6173w6d gestation. Patient's last menstrual period was 06/03/2016. Estimated Date of Delivery: 04/04/17   [facility]   Chief Complaint: high risk pregnancy, need for antepartum surveillance, di-di twins, preeclampsia  S: Resting comfortably. no CTX, no VB.no LOF,  Active fetal movement.  Denies: HA, visual changes, SOB, or RUQ/epigastric pain   Maternal Medical History:   Past Medical History:  Diagnosis Date  . Allergic rhinitis   . Complication of anesthesia    woke during T&A  . Depression    lost child 08/2011  . Generalized headaches    frequent  . H/O: menorrhagia    to start depo for this  . 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 History of syncope    neurocardiogenic per pt  . Hx: UTI (urinary tract infection)   . Lupus anticoagulant positive   . Migraine headache    [facility]  . Motion sickness    all moving vehicles  . Obesity    lost >100 lbs, now gained back some, previously ran 2 mi in am 1 in pm  . Seasonal allergies   . Wears contact lenses     Past Surgical History:  Procedure Laterality Date  . CESAREAN SECTION    . COLONOSCOPY WITH PROPOFOL N/A 05/16/2015   Procedure: COLONOSCOPY WITH PROPOFOL;  Surgeon: Midge Miniumarren Wohl, MD;  [facility];  Service: Endoscopy;  Laterality: N/A;  . EXPLORATORY LAPAROTOMY  2006   Excessive vaginal bleeding  . EYE SURGERY  1999  . TONSILLECTOMY AND ADENOIDECTOMY  1998    Allergies  Allergen Reactions  . Warfarin Sodium     H/o GI bleed.      Prior to Admission medications   Medication Sig Start Date End Date Taking? Authorizing Provider  aspirin EC 81 MG tablet Take 81 mg by mouth daily.   Yes [provider]  folic acid (FOLVITE) 1 MG tablet Take 1 mg by mouth daily.   Yes [provider]   metoprolol tartrate (LOPRESSOR) 50 MG tablet Take 50 mg by mouth 2 (two) times daily.   Yes [provider]  Prenatal Vit-Fe Fumarate-FA (MULTIVITAMIN-PRENATAL) 27-0.8 MG TABS tablet Take 1 tablet by mouth daily at 12 noon.   Yes [provider]  sertraline (ZOLOFT) 100 MG tablet Take 200 mg by mouth daily.    Yes [provider]     Social History: She  reports that she has never smoked. She has never used smokeless tobacco. She reports that she does not drink alcohol or use drugs.  Family History: family history includes Alcohol abuse in her maternal grandfather and paternal grandfather; Arthritis in her maternal grandfather and paternal grandfather; Breast cancer in her paternal grandmother; Colitis in her father; Coronary artery disease (age of onset: 7560) in her paternal grandfather; Diabetes in her father; Heart disease in her maternal grandfather, maternal grandmother, paternal grandfather, and paternal grandmother; Hyperlipidemia in her father; Hypertension in her father, maternal grandmother, mother, and paternal grandmother; Lung cancer in her maternal grandfather.    Review of Systems: A full review of systems was performed and negative except as noted in the HPI.     O:  BP (!) 136/93   Pulse (!) 130   Temp 99.1 F (37.3 C) (Oral)  LMP 06/03/2016  No results found for this or any previous visit (from the past 48 hour(s)).   Constitutional: NAD, AAOx3  HE/ENT: extraocular movements grossly intact, moist mucous membranes CV: RRR PULM: nl respiratory effort, CTABL     Abd: gravid, non-tender, non-distended, soft      Ext: Non-tender, Nonedmeatous   Psych: mood appropriate, speech normal Pelvic:deferred  Baby A Baseline: 145 Variability: moderate Accelerations present x >2 Decelerations absent Time  Baby B Baseline: 155 Variability: moderate Accelerations present x >2 Decelerations absent Time   A/P: 31 y.o. [redacted]w[redacted]d with  high risk pregnancy and antepartum surveillance.   Labor: not present.   Fetal Wellbeing: Reassuring Cat 1 tracing.  Reactive NST x2  Preeclampsia:  No s/sx worsening  D/c home stable, precautions reviewed, follow-up as scheduled.   ----- Ranae Plumber, MD Attending Obstetrician and Gynecologist [facility], Department of OB/GYN [facility]

## 2017-03-11 ENCOUNTER — Other Ambulatory Visit (HOSPITAL_COMMUNITY): Payer: Self-pay | Admitting: Pharmacy Technician

## 2017-03-13 ENCOUNTER — Encounter
Admission: RE | Admit: 2017-03-13 | Discharge: 2017-03-13 | Disposition: A | Discharge status: Home / Self Care | Payer: Managed Care, Other (non HMO) | Source: Ambulatory Visit | Attending: Obstetrics & Gynecology | Admitting: Obstetrics & Gynecology

## 2017-03-13 ENCOUNTER — Other Ambulatory Visit: Payer: Self-pay | Admitting: Obstetrics and Gynecology

## 2017-03-13 HISTORY — DX: Personal history of urinary calculi: Z87.442

## 2017-03-13 HISTORY — DX: Personal history of other diseases of the digestive system: Z87.19

## 2017-03-13 HISTORY — DX: Unspecified asthma, uncomplicated: J45.909

## 2017-03-13 HISTORY — DX: Personal history of peptic ulcer disease: Z87.11

## 2017-03-13 LAB — CBC
HEMATOCRIT: 40.9 % (ref 35.0–47.0)
HEMOGLOBIN: 13.9 g/dL (ref 12.0–16.0)
MCH: 29.2 pg (ref 26.0–34.0)
MCHC: 34 g/dL (ref 32.0–36.0)
MCV: 85.9 fL (ref 80.0–100.0)
Platelets: 152 10*3/uL (ref 150–440)
RBC: 4.76 MIL/uL (ref 3.80–5.20)
RDW: 15.7 % — ABNORMAL HIGH (ref 11.5–14.5)
WBC: 9.2 10*3/uL (ref 3.6–11.0)

## 2017-03-13 LAB — TYPE AND SCREEN
ABO/RH(D): O NEG
Antibody Screen: NEGATIVE
Extend sample reason: UNDETERMINED

## 2017-03-13 NOTE — Patient Instructions (Signed)
  Your procedure is scheduled on: March 16, 2017 (MONDAY) Report to  EMERGENCY DEPARTMENT ARRIVAL TIME 11:00 AM  Remember: Instructions that are not followed completely may result in serious medical risk, up to and including death, or upon the discretion of your surgeon and anesthesiologist your surgery may need to be rescheduled.    _x___ 1. Do not eat food or drink liquids after midnight. No gum chewing or hard candies                                __x__ 2. No Alcohol for 24 hours before or after surgery.   __x__3. No Smoking for 24 prior to surgery.   ____  4. Bring all medications with you on the day of surgery if instructed.    __x__ 5. Notify your doctor if there is any change in your medical condition     (cold, fever, infections).     Do not wear jewelry, make-up, hairpins, clips or nail polish.  Do not wear lotions, powders, or perfumes.   Do not shave 48 hours prior to surgery. Men may shave face and neck.  Do not bring valuables to the hospital.    [facility] is not responsible for any belongings or valuables.               Contacts, dentures or bridgework may not be worn into surgery.  Leave your suitcase in the car. After surgery it may be brought to your room.  For patients admitted to the hospital, discharge time is determined by your treatment team                    Patients discharged the day of surgery will not be allowed to drive home.  You will need someone to drive you home and stay with you the night of your procedure.    Please read over the following fact sheets that you were given:   [facility] Preparing for Surgery and or MRSA Information   _x___ Take the following mediations the morning of surgery with a sip of water :  1. METOPROLOL  2. SERTRALINE  3.  4.  5.  6.  ____Fleets enema or Magnesium Citrate as directed.   _x___ Use CHG Soap or sage wipes as directed on instruction sheet   ____ Use inhalers on the day of surgery and bring to  hospital day of surgery  ____ Stop Metformin and Janumet 2 days prior to surgery.    ____ Take 1/2 of usual insulin dose the night before surgery and none on the morning surgery      _x___ Follow recommendations from Cardiologist, Pulmonologist or PCP regarding          stopping Aspirin, Coumadin, Plavix ,Eliquis, Effient, or Pradaxa, and Pletal.  X____Stop Anti-inflammatories such as Advil, Aleve, Ibuprofen, Motrin, Naproxen, Naprosyn, Goodies powders or aspirin products. OK to take Tylenol                           _x___ Stop supplements until after surgery.  But may continue Vitamin D, Vitamin B, and multivitamin        ____ Bring C-Pap to the hospital.

## 2017-03-16 ENCOUNTER — Inpatient Hospital Stay: Payer: Managed Care, Other (non HMO) | Admitting: Anesthesiology

## 2017-03-16 ENCOUNTER — Emergency Department
Admission: EM | Admit: 2017-03-16 | Discharge: 2017-03-16 | Discharge status: Absent without leave | Payer: Managed Care, Other (non HMO)

## 2017-03-16 ENCOUNTER — Inpatient Hospital Stay
Admission: RE | Admit: 2017-03-16 | Discharge: 2017-03-20 | DRG: 765 | Disposition: A | Discharge status: Home / Self Care | Payer: Managed Care, Other (non HMO) | Source: Ambulatory Visit | Attending: Obstetrics & Gynecology | Admitting: Obstetrics & Gynecology

## 2017-03-16 ENCOUNTER — Encounter
Admission: RE | Disposition: A | Discharge status: Home / Self Care | Payer: Self-pay | Source: Ambulatory Visit | Attending: Obstetrics & Gynecology

## 2017-03-16 ENCOUNTER — Encounter: Payer: Self-pay | Admitting: *Deleted

## 2017-03-16 DIAGNOSIS — Z6791 Unspecified blood type, Rh negative: Secondary | ICD-10-CM | POA: Diagnosis not present

## 2017-03-16 DIAGNOSIS — O1494 Unspecified pre-eclampsia, complicating childbirth: Principal | ICD-10-CM | POA: Diagnosis present

## 2017-03-16 DIAGNOSIS — O26893 Other specified pregnancy related conditions, third trimester: Secondary | ICD-10-CM | POA: Diagnosis present

## 2017-03-16 DIAGNOSIS — Z302 Encounter for sterilization: Secondary | ICD-10-CM | POA: Diagnosis not present

## 2017-03-16 DIAGNOSIS — O9902 Anemia complicating childbirth: Secondary | ICD-10-CM | POA: Diagnosis present

## 2017-03-16 DIAGNOSIS — D696 Thrombocytopenia, unspecified: Secondary | ICD-10-CM | POA: Diagnosis present

## 2017-03-16 DIAGNOSIS — O34211 Maternal care for low transverse scar from previous cesarean delivery: Secondary | ICD-10-CM | POA: Diagnosis present

## 2017-03-16 DIAGNOSIS — I498 Other specified cardiac arrhythmias: Secondary | ICD-10-CM | POA: Diagnosis present

## 2017-03-16 DIAGNOSIS — O9912 Other diseases of the blood and blood-forming organs and certain disorders involving the immune mechanism complicating childbirth: Secondary | ICD-10-CM | POA: Diagnosis present

## 2017-03-16 DIAGNOSIS — O9942 Diseases of the circulatory system complicating childbirth: Secondary | ICD-10-CM | POA: Diagnosis present

## 2017-03-16 DIAGNOSIS — Z3A37 37 weeks gestation of pregnancy: Secondary | ICD-10-CM | POA: Diagnosis not present

## 2017-03-16 DIAGNOSIS — O99214 Obesity complicating childbirth: Secondary | ICD-10-CM | POA: Diagnosis present

## 2017-03-16 DIAGNOSIS — O30043 Twin pregnancy, dichorionic/diamniotic, third trimester: Secondary | ICD-10-CM | POA: Diagnosis present

## 2017-03-16 DIAGNOSIS — R000 Tachycardia, unspecified: Secondary | ICD-10-CM

## 2017-03-16 DIAGNOSIS — Z6841 Body Mass Index (BMI) 40.0 and over, adult: Secondary | ICD-10-CM | POA: Diagnosis not present

## 2017-03-16 DIAGNOSIS — G90A Postural orthostatic tachycardia syndrome (POTS): Secondary | ICD-10-CM

## 2017-03-16 DIAGNOSIS — I951 Orthostatic hypotension: Secondary | ICD-10-CM

## 2017-03-16 LAB — ABO/RH: ABO/RH(D): O NEG

## 2017-03-16 SURGERY — Surgical Case
Anesthesia: Spinal

## 2017-03-16 MED ORDER — DIPHENHYDRAMINE HCL 25 MG PO CAPS
ORAL_CAPSULE | ORAL | Status: AC
  Filled 2017-03-16: qty 1

## 2017-03-16 MED ORDER — LACTATED RINGERS IV SOLN: Freq: Once | INTRAVENOUS | Status: DC

## 2017-03-16 MED ORDER — SODIUM CHLORIDE 0.9 % IV SOLN
Freq: Once | INTRAVENOUS | Status: DC
  Filled 2017-03-16: qty 20

## 2017-03-16 MED ORDER — IBUPROFEN 600 MG PO TABS
600.0000 mg | ORAL_TABLET | Freq: Four times a day (QID) | ORAL | Status: DC
  Administered 2017-03-16: 600 mg via ORAL
  Filled 2017-03-16: qty 1

## 2017-03-16 MED ORDER — DIPHENHYDRAMINE HCL 25 MG PO CAPS
25.0000 mg | ORAL_CAPSULE | Freq: Four times a day (QID) | ORAL | Status: DC | PRN
  Administered 2017-03-16: 25 mg via ORAL

## 2017-03-16 MED ORDER — ONDANSETRON HCL 4 MG/2ML IJ SOLN: 4.0000 mg | Freq: Once | INTRAMUSCULAR | Status: DC | PRN

## 2017-03-16 MED ORDER — SODIUM CHLORIDE 0.9 % IJ SOLN
INTRAMUSCULAR | Status: AC
  Filled 2017-03-16: qty 50

## 2017-03-16 MED ORDER — ACETAMINOPHEN 650 MG RE SUPP
650.0000 mg | Freq: Once | RECTAL | Status: AC
  Administered 2017-03-16: 650 mg via RECTAL
  Filled 2017-03-16: qty 1

## 2017-03-16 MED ORDER — SODIUM CHLORIDE 0.9% FLUSH: 3.0000 mL | Freq: Two times a day (BID) | INTRAVENOUS | Status: DC

## 2017-03-16 MED ORDER — MEPERIDINE HCL 25 MG/ML IJ SOLN: 6.2500 mg | INTRAMUSCULAR | Status: DC | PRN

## 2017-03-16 MED ORDER — NALBUPHINE HCL 10 MG/ML IJ SOLN: 5.0000 mg | INTRAMUSCULAR | Status: DC | PRN

## 2017-03-16 MED ORDER — ONDANSETRON HCL 4 MG/2ML IJ SOLN
INTRAMUSCULAR | Status: DC | PRN
  Administered 2017-03-16: 4 mg via INTRAVENOUS

## 2017-03-16 MED ORDER — EPHEDRINE SULFATE 50 MG/ML IJ SOLN
INTRAMUSCULAR | Status: DC | PRN
  Administered 2017-03-16: 5 mg via INTRAVENOUS

## 2017-03-16 MED ORDER — PRENATAL MULTIVITAMIN CH
1.0000 | ORAL_TABLET | Freq: Every day | ORAL | Status: DC
  Administered 2017-03-17: 1 via ORAL

## 2017-03-16 MED ORDER — CEFAZOLIN SODIUM 10 G IJ SOLR
3.0000 g | INTRAMUSCULAR | Status: AC
  Administered 2017-03-16 (×2): 3 g via INTRAVENOUS
  Filled 2017-03-16: qty 3000

## 2017-03-16 MED ORDER — SOD CITRATE-CITRIC ACID 500-334 MG/5ML PO SOLN
ORAL | Status: AC
  Administered 2017-03-16: 30 mL
  Filled 2017-03-16: qty 15

## 2017-03-16 MED ORDER — ACETAMINOPHEN 500 MG PO TABS
1000.0000 mg | ORAL_TABLET | Freq: Four times a day (QID) | ORAL | Status: DC
  Administered 2017-03-17 – 2017-03-20 (×12): 1000 mg via ORAL
  Filled 2017-03-16 (×12): qty 2

## 2017-03-16 MED ORDER — BUPIVACAINE HCL 0.5 % IJ SOLN
30.0000 mL | Freq: Once | INTRAMUSCULAR | Status: DC
  Filled 2017-03-16: qty 30

## 2017-03-16 MED ORDER — FENTANYL CITRATE (PF) 100 MCG/2ML IJ SOLN
INTRAMUSCULAR | Status: AC
  Filled 2017-03-16: qty 2

## 2017-03-16 MED ORDER — MORPHINE SULFATE (PF) 0.5 MG/ML IJ SOLN
INTRAMUSCULAR | Status: AC
  Filled 2017-03-16: qty 10

## 2017-03-16 MED ORDER — KETOROLAC TROMETHAMINE 30 MG/ML IJ SOLN: 30.0000 mg | Freq: Four times a day (QID) | INTRAMUSCULAR | Status: AC | PRN

## 2017-03-16 MED ORDER — ACETAMINOPHEN 500 MG PO TABS
1000.0000 mg | ORAL_TABLET | Freq: Four times a day (QID) | ORAL | Status: AC
  Administered 2017-03-17: 1000 mg via ORAL

## 2017-03-16 MED ORDER — MORPHINE SULFATE (PF) 0.5 MG/ML IJ SOLN
INTRAMUSCULAR | Status: DC | PRN
  Administered 2017-03-16: .2 mg via EPIDURAL

## 2017-03-16 MED ORDER — SERTRALINE HCL 100 MG PO TABS
200.0000 mg | ORAL_TABLET | Freq: Every day | ORAL | Status: DC
  Administered 2017-03-17 – 2017-03-20 (×4): 200 mg via ORAL
  Filled 2017-03-16 (×4): qty 2

## 2017-03-16 MED ORDER — FENTANYL CITRATE (PF) 100 MCG/2ML IJ SOLN: 25.0000 ug | INTRAMUSCULAR | Status: DC | PRN

## 2017-03-16 MED ORDER — LACTATED RINGERS IV SOLN
INTRAVENOUS | Status: DC
  Administered 2017-03-16: 1000 mL via INTRAVENOUS

## 2017-03-16 MED ORDER — OXYCODONE HCL 5 MG PO TABS
10.0000 mg | ORAL_TABLET | ORAL | Status: DC | PRN
  Administered 2017-03-18 – 2017-03-20 (×4): 10 mg via ORAL
  Filled 2017-03-16 (×4): qty 2

## 2017-03-16 MED ORDER — OXYTOCIN 40 UNITS IN LACTATED RINGERS INFUSION - SIMPLE MED: 2.5000 [IU]/h | INTRAVENOUS | Status: AC

## 2017-03-16 MED ORDER — SODIUM CHLORIDE 0.9% FLUSH: 3.0000 mL | INTRAVENOUS | Status: DC | PRN

## 2017-03-16 MED ORDER — SODIUM CHLORIDE 0.9 % IV SOLN
INTRAVENOUS | Status: DC | PRN
  Administered 2017-03-16: 50 ug/min via INTRAVENOUS

## 2017-03-16 MED ORDER — DIPHENHYDRAMINE HCL 25 MG PO CAPS: 25.0000 mg | ORAL_CAPSULE | ORAL | Status: DC | PRN

## 2017-03-16 MED ORDER — COCONUT OIL OIL
1.0000 "application " | TOPICAL_OIL | Status: DC | PRN
  Administered 2017-03-19: 1 via TOPICAL
  Filled 2017-03-16: qty 120

## 2017-03-16 MED ORDER — DIBUCAINE 1 % RE OINT: 1.0000 "application " | TOPICAL_OINTMENT | RECTAL | Status: DC | PRN

## 2017-03-16 MED ORDER — SODIUM CHLORIDE 0.9 % IV SOLN: 250.0000 mL | INTRAVENOUS | Status: DC

## 2017-03-16 MED ORDER — WITCH HAZEL-GLYCERIN EX PADS: 1.0000 "application " | MEDICATED_PAD | CUTANEOUS | Status: DC | PRN

## 2017-03-16 MED ORDER — LACTATED RINGERS IV SOLN
INTRAVENOUS | Status: DC
  Administered 2017-03-17: 03:00:00 via INTRAVENOUS

## 2017-03-16 MED ORDER — OXYTOCIN 40 UNITS IN LACTATED RINGERS INFUSION - SIMPLE MED
INTRAVENOUS | Status: AC
  Administered 2017-03-16: 11:00:00
  Filled 2017-03-16: qty 1000

## 2017-03-16 MED ORDER — BUPIVACAINE LIPOSOME 1.3 % IJ SUSP
INTRAMUSCULAR | Status: AC
  Filled 2017-03-16: qty 20

## 2017-03-16 MED ORDER — PROPOFOL 10 MG/ML IV BOLUS
INTRAVENOUS | Status: AC
  Filled 2017-03-16: qty 20

## 2017-03-16 MED ORDER — OXYTOCIN 40 UNITS IN LACTATED RINGERS INFUSION - SIMPLE MED
INTRAVENOUS | Status: AC
  Filled 2017-03-16: qty 1000

## 2017-03-16 MED ORDER — BUPIVACAINE HCL (PF) 0.5 % IJ SOLN
INTRAMUSCULAR | Status: AC
  Filled 2017-03-16: qty 30

## 2017-03-16 MED ORDER — OXYCODONE HCL 5 MG PO TABS
5.0000 mg | ORAL_TABLET | ORAL | Status: DC | PRN
  Administered 2017-03-17 – 2017-03-19 (×5): 5 mg via ORAL
  Filled 2017-03-16 (×5): qty 1

## 2017-03-16 MED ORDER — BUPIVACAINE LIPOSOME 1.3 % IJ SUSP: 20.0000 mL | Freq: Once | INTRAMUSCULAR | Status: DC

## 2017-03-16 MED ORDER — BUPIVACAINE IN DEXTROSE 0.75-8.25 % IT SOLN
INTRATHECAL | Status: DC | PRN
  Administered 2017-03-16: 1.8 mL via INTRATHECAL

## 2017-03-16 MED ORDER — SODIUM CHLORIDE 0.9 % IV SOLN
INTRAVENOUS | Status: DC | PRN
  Administered 2017-03-16: 70 mL

## 2017-03-16 MED ORDER — MENTHOL 3 MG MT LOZG
1.0000 | LOZENGE | OROMUCOSAL | Status: DC | PRN
  Filled 2017-03-16: qty 9

## 2017-03-16 MED ORDER — SENNOSIDES-DOCUSATE SODIUM 8.6-50 MG PO TABS
2.0000 | ORAL_TABLET | ORAL | Status: DC
  Administered 2017-03-17 – 2017-03-20 (×4): 2 via ORAL
  Filled 2017-03-16 (×4): qty 2

## 2017-03-16 MED ORDER — SIMETHICONE 80 MG PO CHEW: 160.0000 mg | CHEWABLE_TABLET | Freq: Four times a day (QID) | ORAL | Status: DC | PRN

## 2017-03-16 MED ORDER — OXYTOCIN 40 UNITS IN LACTATED RINGERS INFUSION - SIMPLE MED
INTRAVENOUS | Status: DC | PRN
  Administered 2017-03-16: 800 mL via INTRAVENOUS

## 2017-03-16 MED ORDER — ONDANSETRON HCL 4 MG/2ML IJ SOLN: 4.0000 mg | Freq: Three times a day (TID) | INTRAMUSCULAR | Status: DC | PRN

## 2017-03-16 MED ORDER — NALOXONE HCL 0.4 MG/ML IJ SOLN: 0.4000 mg | INTRAMUSCULAR | Status: DC | PRN

## 2017-03-16 MED ORDER — LABETALOL HCL 100 MG PO TABS
50.0000 mg | ORAL_TABLET | Freq: Every day | ORAL | Status: DC
  Administered 2017-03-17 – 2017-03-20 (×4): 50 mg via ORAL
  Filled 2017-03-16 (×4): qty 1

## 2017-03-16 MED ORDER — SODIUM CHLORIDE 0.9 % IJ SOLN: 50.0000 mL | Freq: Once | INTRAMUSCULAR | Status: DC

## 2017-03-16 MED ORDER — BUPIVACAINE HCL (PF) 0.5 % IJ SOLN: 30.0000 mL | Freq: Once | INTRAMUSCULAR | Status: DC

## 2017-03-16 MED ORDER — DIPHENHYDRAMINE HCL 50 MG/ML IJ SOLN: 12.5000 mg | INTRAMUSCULAR | Status: DC | PRN

## 2017-03-16 MED ORDER — PHENYLEPHRINE HCL 10 MG/ML IJ SOLN
INTRAMUSCULAR | Status: DC | PRN
  Administered 2017-03-16: 100 ug via INTRAVENOUS

## 2017-03-16 MED ORDER — BUPIVACAINE HCL 0.5 % IJ SOLN
INTRAMUSCULAR | Status: DC | PRN
  Administered 2017-03-16: 30 mL

## 2017-03-16 SURGICAL SUPPLY — 49 items
ADH SKN CLS APL DERMABOND .7 (GAUZE/BANDAGES/DRESSINGS) ×2
BAG COUNTER SPONGE EZ (MISCELLANEOUS) ×2 IMPLANT
BAG SPNG 4X4 CLR HAZ (MISCELLANEOUS) ×2
BARRIER ADHS 3X4 INTERCEED (GAUZE/BANDAGES/DRESSINGS) ×2 IMPLANT
BRR ADH 4X3 ABS CNTRL BYND (GAUZE/BANDAGES/DRESSINGS) ×1
CANISTER SUCT 3000ML PPV (MISCELLANEOUS) ×5 IMPLANT
CLOSURE WOUND 1/2 X4 (GAUZE/BANDAGES/DRESSINGS)
CONTAINER UMBILICUP CORD BLD (MISCELLANEOUS) IMPLANT
CORD BLOOD COLLECTION DEVICE IMPLANT
COUNTER SPONGE BAG EZ (MISCELLANEOUS) ×2
DECANTER SPIKE VIAL GLASS SM (MISCELLANEOUS) ×2 IMPLANT
DERMABOND ADVANCED (GAUZE/BANDAGES/DRESSINGS) ×4
DERMABOND ADVANCED .7 DNX12 (GAUZE/BANDAGES/DRESSINGS) ×1 IMPLANT
DRSG TELFA 3X8 NADH (GAUZE/BANDAGES/DRESSINGS) IMPLANT
ELECT CAUTERY BLADE 6.4 (BLADE) ×3 IMPLANT
ELECT REM PT RETURN 9FT ADLT (ELECTROSURGICAL) ×3
ELECTRODE REM PT RTRN 9FT ADLT (ELECTROSURGICAL) ×1 IMPLANT
GAUZE SPONGE 4X4 12PLY STRL (GAUZE/BANDAGES/DRESSINGS) ×1 IMPLANT
GLOVE PI ORTHOPRO 6.5 (GLOVE) ×2
GLOVE PI ORTHOPRO STRL 6.5 (GLOVE) ×1 IMPLANT
GLOVE SURG SYN 6.5 ES PF (GLOVE) ×6 IMPLANT
GLOVE SURG SYN 6.5 PF PI (GLOVE) ×1 IMPLANT
GOWN STRL REUS W/ TWL LRG LVL3 (GOWN DISPOSABLE) ×3 IMPLANT
GOWN STRL REUS W/TWL LRG LVL3 (GOWN DISPOSABLE) ×9
NEEDLE HYPO 22GX1.5 SAFETY (NEEDLE) ×2 IMPLANT
NS IRRIG 1000ML POUR BTL (IV SOLUTION) ×3 IMPLANT
PACK C SECTION AR (MISCELLANEOUS) ×3 IMPLANT
PAD DRESSING TELFA 3X8 NADH (GAUZE/BANDAGES/DRESSINGS) ×1 IMPLANT
PAD OB MATERNITY 4.3X12.25 (PERSONAL CARE ITEMS) ×5 IMPLANT
PAD PREP 24X41 OB/GYN DISP (PERSONAL CARE ITEMS) ×3 IMPLANT
RTRCTR C-SECT PINK 25CM LRG (MISCELLANEOUS) ×2 IMPLANT
SPONGE LAP 18X18 5 PK (GAUZE/BANDAGES/DRESSINGS) ×2 IMPLANT
STAPLER INSORB 30 2030 C-SECTI (MISCELLANEOUS) ×2 IMPLANT
STRAP SAFETY BODY (MISCELLANEOUS) ×3 IMPLANT
STRIP CLOSURE SKIN 1/2X4 (GAUZE/BANDAGES/DRESSINGS) ×1 IMPLANT
SUT MNCRL 4-0 (SUTURE) ×3
SUT MNCRL 4-0 27XMFL (SUTURE) ×1
SUT PDS AB 1 TP1 96 (SUTURE) ×3 IMPLANT
SUT VIC AB 0 CT1 36 (SUTURE) ×8 IMPLANT
SUT VIC AB 2-0 CT1 27 (SUTURE) ×6
SUT VIC AB 2-0 CT1 TAPERPNT 27 (SUTURE) ×1 IMPLANT
SUT VIC AB 3-0 SH 27 (SUTURE) ×3
SUT VIC AB 3-0 SH 27X BRD (SUTURE) ×1 IMPLANT
SUTURE MNCRL 4-0 27XMF (SUTURE) ×1 IMPLANT
SWABSTK COMLB BENZOIN TINCTURE (MISCELLANEOUS) ×1 IMPLANT
SYR 30ML LL (SYRINGE) ×4 IMPLANT
TOWEL OR 17X26 4PK STRL BLUE (TOWEL DISPOSABLE) ×2 IMPLANT
UMBILICAL CORD CLAMP ×2 IMPLANT
UMBILICUP CORD BLD COLLECTION (MISCELLANEOUS) ×3

## 2017-03-16 NOTE — Anesthesia Preprocedure Evaluation (Signed)
Anesthesia Evaluation  Patient identified by MRN, date of birth, ID band Patient awake    Reviewed: Allergy & Precautions, NPO status , Patient's Chart, lab work & pertinent test results, reviewed documented beta blocker date and time   Airway Mallampati: III  TM Distance: >3 FB     Dental  (+) Chipped   Pulmonary asthma ,           Cardiovascular hypertension, Pt. on home beta blockers and Pt. on medications      Neuro/Psych  Headaches, PSYCHIATRIC DISORDERS Depression    GI/Hepatic   Endo/Other  Morbid obesity  Renal/GU      Musculoskeletal   Abdominal   Peds  Hematology   Anesthesia Other Findings Hx of PE and syncope.  Reproductive/Obstetrics                             Anesthesia Physical Anesthesia Plan  ASA: III  Anesthesia Plan: Spinal   Post-op Pain Management:    Induction:   PONV Risk Score and Plan:   Airway Management Planned:   Additional Equipment:   Intra-op Plan:   Post-operative Plan:   Informed Consent: I have reviewed the patients History and Physical, chart, labs and discussed the procedure including the risks, benefits and alternatives for the proposed anesthesia with the patient or authorized representative who has indicated his/her understanding and acceptance.     Plan Discussed with: CRNA  Anesthesia Plan Comments:         Anesthesia Quick Evaluation

## 2017-03-16 NOTE — Anesthesia Post-op Follow-up Note (Cosign Needed)
Anesthesia QCDR form completed.        

## 2017-03-16 NOTE — OR Nursing (Signed)
Fundus firm post-op

## 2017-03-16 NOTE — Progress Notes (Signed)
Patient ID: Debra Terry, female   DOB: 27-May-1986, 31 y.o.   MRN: [id number] Called from pp for pt meds: Pt takes normally Labetalol 50 mg po in pregnancy for POTS and prior to pregnancy 100 mg po qd.  Pt normally takes 200 mg po of Zoloft daily.. Will restart these meds in am. When pt is discharged, can add back the Labetalol 100 mg po qd as her pre-pregnant dose to prevent tachycardia from POTS.

## 2017-03-16 NOTE — Interval H&P Note (Signed)
History and Physical Interval Note:  03/16/2017 7:25 AM  Debra Terry  has presented today for surgery, with the diagnosis of Prior Cesarean  Twins  The various methods of treatment have been discussed with the patient and family. After consideration of risks, benefits and other options for treatment, the patient has consented to  Procedure(s): CESAREAN SECTION (N/A) as a surgical intervention .  The patient's history has been reviewed, patient examined, no change in status, stable for surgery.  I have reviewed the patient's chart and labs.  Questions were answered to the patient's satisfaction.     Oral Hallgren C Avish Torry

## 2017-03-16 NOTE — Anesthesia Procedure Notes (Signed)
Spinal  Patient location during procedure: OR Staffing Anesthesiologist: Jorma Tassinari Performed: anesthesiologist  Preanesthetic Checklist Completed: patient identified, site marked, surgical consent, pre-op evaluation, timeout performed, IV checked and risks and benefits discussed Spinal Block Patient position: sitting Prep: Betadine Patient monitoring: heart rate, cardiac monitor, continuous pulse ox and blood pressure Approach: midline Location: L3-4 Injection technique: single-shot Needle Needle type: Pencil-Tip  Needle gauge: 25 G Needle length: 9 cm Assessment Sensory level: T10     

## 2017-03-16 NOTE — Transfer of Care (Signed)
Immediate Anesthesia Transfer of Care Note  Patient: Debra Terry  Procedure(s) Performed: Procedure(s): CESAREAN SECTION WITH BILATERAL TUBAL LIGATION (N/A)  Patient Location: Nursing Unit  Anesthesia Type:Spinal  Level of Consciousness: awake, alert  and oriented  Airway & Oxygen Therapy: Patient Spontanous Breathing  Post-op Assessment: Post -op Vital signs reviewed and stable  Post vital signs: stable  Last Vitals:  Vitals:   03/16/17 0714 03/16/17 0950  BP: (!) 125/100 115/77  Pulse:  78  Resp: 18 18  Temp: 36.8 C 36.5 C    Last Pain:  Vitals:   03/16/17 0950  TempSrc: Oral  PainSc:          Complications: No apparent anesthesia complications

## 2017-03-17 ENCOUNTER — Encounter: Payer: Self-pay | Admitting: Obstetrics & Gynecology

## 2017-03-17 LAB — CBC
HCT: 28.6 % — ABNORMAL LOW (ref 35.0–47.0)
HEMATOCRIT: 28.8 % — AB (ref 35.0–47.0)
HEMOGLOBIN: 9.7 g/dL — AB (ref 12.0–16.0)
HEMOGLOBIN: 9.9 g/dL — AB (ref 12.0–16.0)
MCH: 29.2 pg (ref 26.0–34.0)
MCH: 29.3 pg (ref 26.0–34.0)
MCHC: 33.9 g/dL (ref 32.0–36.0)
MCHC: 34.3 g/dL (ref 32.0–36.0)
MCV: 85.9 fL (ref 80.0–100.0)
MCV: 85.6 fL (ref 80.0–100.0)
PLATELETS: 97 10*3/uL — AB (ref 150–440)
Platelets: 122 10*3/uL — ABNORMAL LOW (ref 150–440)
RBC: 3.33 MIL/uL — AB (ref 3.80–5.20)
RBC: 3.37 MIL/uL — ABNORMAL LOW (ref 3.80–5.20)
RDW: 15.5 % — ABNORMAL HIGH (ref 11.5–14.5)
RDW: 16 % — ABNORMAL HIGH (ref 11.5–14.5)
WBC: 6.8 10*3/uL (ref 3.6–11.0)
WBC: 8.1 10*3/uL (ref 3.6–11.0)

## 2017-03-17 LAB — FETAL SCREEN: Fetal Screen: NEGATIVE

## 2017-03-17 LAB — SURGICAL PATHOLOGY

## 2017-03-17 MED ORDER — IBUPROFEN 600 MG PO TABS
600.0000 mg | ORAL_TABLET | Freq: Four times a day (QID) | ORAL | Status: DC
  Administered 2017-03-17 – 2017-03-20 (×14): 600 mg via ORAL
  Filled 2017-03-17 (×14): qty 1

## 2017-03-17 MED ORDER — RHO D IMMUNE GLOBULIN 1500 UNIT/2ML IJ SOSY
300.0000 ug | PREFILLED_SYRINGE | Freq: Once | INTRAMUSCULAR | Status: AC
  Administered 2017-03-17: 300 ug via INTRAVENOUS
  Filled 2017-03-17: qty 2

## 2017-03-17 NOTE — Op Note (Addendum)
Cesarean Section Procedure Note  03/16/2017  Patient:  Debra Terry  31 y.o. female at 4724w2d.  Patient's last menstrual period was 06/03/2016. Preoperative diagnosis:  Prior Cesarean , Twins, desrire for sterilization, preeclampsia Postoperative diagnosis:  prior cesarean section, twins, desire for sterilization  PROCEDURE:  Procedure(s): CESAREAN SECTION WITH BILATERAL TUBAL LIGATION (N/A) BILATERAL TUBAL LIGATION  Surgeon:  Surgeon(s) and Role:    * Amyrah Pinkhasov, Elenora Fenderhelsea C, MD - Primary Anesthesia:  spinal I/O:  900cc in/ 1000cc EBL UOP 300cc Specimens:  Cord Blood x2,  Placenta x2, portion of right tube, portion of left tube Complications: None Apparent Disposition:  VS stable to PACU  Findings: normal uterus, tubes and ovaries bilaterally Omental adhesion to uterine fundus and anterior abdominal wall near bladder.  Amniotic sacs were competing through the hysterotomy, dominant sac was on the RIGHT and was ruptured first. Live born female baby A (RIGHT) was delivered cephalic through clear fluid and was flaccid and pale.  His eyes were open but he made no movements nor was able to be stimulated upon delivery.   Live born female baby B (LEFT) was delivered cephalic through clear fluid and was also flaccid and pale.  His eyes were also open and appeared identical to his brother in tone, color, and movements.   Placentas were discoid, divided, but connected through an avascular membrane.     Viona GilmoreFlint, Itzelle Boy A [[id number]][[id number]]  Live born female  Birth Weight: 6 lb 12.3 oz (3070 g) APGAR: 4, 6   Vinie SillFlint, Emalea Boy B [[id number]][[id number]]  Live born female  Birth Weight: 6 lb 10.9 oz (3030 g) APGAR: 5, 7   Indication for procedure: 31 y.o. female at 7824w2d with spontaneous di-di twin gestation and development of preeclampsia at 33wks.  Desired permanent sterilization.  Procedure Details   The risks, benefits, complications, treatment options, and expected outcomes were discussed with the patient. Informed  consent was obtained. The patient was taken to Operating Room, identified as Debra Terry and the procedure verified as a cesarean delivery with bilateral tubal ligation.   After administration of anesthesia, the patient was prepped and draped in the usual sterile manner, including a vaginal prep. A surgical time out was performed, with the pediatric team present. After confirming adequate anesthesia, a Pfannenstiel incision was made and carried down through the subcutaneous tissue to the fascia. Fascial incision was made and extended transversely. The fascia was separated from the underlying rectus tissue superiorly and inferiorly. The peritoneum was identified and entered. Peritoneal incision was extended longitudinally.  The omentum was identified and broadly spread over the uterus.  There was a 3cm adhesion point on the anterior abdominal wall; a kelly clamp was placed and bovie used to separate the omentum from the abdominal wall. A 0-vicryl free tie was placed and the omentum was placed in the upper abdomen, out of the surgical field. The Alexis retractor was placed in the abdomen and positioned.  A low transverse uterine incision was made and enlarged manually.  Both amniotic sacs were visible through the hysterotomy, and the dominant right side was ruptured with the Alis clamp.  Delivered from cephalic presentation was a live born female. Delayed cord clamping was not performed due to absent tone, pallor, and no response to stimuli. The umbilical cord was doubly clamped and cut, and the baby was handed off to the awaitng pediatrician. Immediately following, the amniotomy was made in the remaining sac, and delivered from cephalic presentation was a live born female. Delayed  cord clamping was not considered due to absent tone, pallor, and no response to stimuli. The umbilical cord was doubly clamped and cut, and the baby was handed off to the awaitng pediatrician.  Cord blood was obtained for evaluation x2.  The placentas were removed intact (one specimen) and appeared normal. The uterus was delivered from the abdominal cavity and cleared of clots, membranes, and debris. The uterus, tubes and ovaries appeared normal. The uterine incision was closed with a running locking suture of 0 Vicryl, and then a second, imbricating stitch was placed. Hemostasis was observed.   The attention was turned to the bilateral fallopian tubes.  The tubes were traced to their fimbriated ends, and grasped at the middle of the isthmus.  The mesosalpinx was opened, and suture ligation was placed at the proximal and distal end of the tube.  The portion of tube between the suture was divided and handed to nursing as portion of left tube and portion of right tube These sites were hemostatic.  The abdominal cavity was evacuated of extraneous fluid. The uterus was returned to the abdominal cavity and again the incision was inspected for hemostasis, which was confirmed.  The paracolic gutters were cleared, and the tubal ligation sutures were observed to be intact.  The fascia was then reapproximated with running suture of vicryl. 60cc of Long- and short-acting bupivicaine was injected circumferentially into the fascia.  After a change of gloves, the subcutaneous tissue was irrigated and reapproximated with 3-0 vicryl. The skin was closed with absorbable staples and 40cc of long- and short-acting bupivacaine injected into the skin and subcutaneous tissues.  The incision was covered with surgical glue.    Instrument, sponge, and needle counts were correct prior the abdominal closure and at the conclusion of the case.   Both newborns were brought to the NICU.   I was present and performed this procedure in its entirety.  ----- Ranae Plumber, MD Attending Obstetrician and Gynecologist [facility], Department of OB/GYN [facility]      ADDENDUM: I spoke to the neonatologist, who confirmed that the initial  APGARs for baby A were 2 (1), 4(5), 6(10), and 7 (20).  I"m unable to clarify why those are not reported as such from the delivery report.  I neglected to obtain cord gases from each cord, but she stated she was certain the event was respiratory, and likely due to a hypotensive event immediately prior to delivery.  Whether this was due to a maternal drop from the spinal or due to compression to the uterine blood supply, or both, is unknown.  As of the time of the completion of the case, both babies were on room air, unsupported, and had demonstrated appropriate tone and response to stimuli.  They will remain under observation.  Ranae Plumber, MD

## 2017-03-17 NOTE — Anesthesia Postprocedure Evaluation (Signed)
Anesthesia Post Note  Patient: Debra Terry  Procedure(s) Performed: Procedure(s) (LRB): CESAREAN SECTION WITH BILATERAL TUBAL LIGATION (N/A)  Patient location during evaluation: Mother Baby Anesthesia Type: Spinal Level of consciousness: awake, awake and alert and oriented Pain management: pain level controlled Vital Signs Assessment: post-procedure vital signs reviewed and stable Respiratory status: spontaneous breathing, nonlabored ventilation and respiratory function stable Cardiovascular status: stable Postop Assessment: no headache, no backache, patient able to bend at knees and no signs of nausea or vomiting Anesthetic complications: no     Last Vitals:  Vitals:   03/16/17 2254 03/17/17 0311  BP: 120/67 137/78  Pulse: 92 85  Resp: 18 18  Temp: 37.3 C 36.9 C    Last Pain:  Vitals:   03/17/17 0710  TempSrc:   PainSc: 4                  Taesean Reth,  Kenlynn Houde R

## 2017-03-17 NOTE — Progress Notes (Signed)
Subjective: Postpartum Day 1: Cesarean Delivery Patient reports  No concerns  Objective: Vital signs in last 24 hours: Temp:  [97.7 F (36.5 C)-99.5 F (37.5 C)] 98.5 F (36.9 C) (07/17 0737) Pulse Rate:  [72-92] 80 (07/17 0737) Resp:  [16-18] 18 (07/17 0737) BP: (115-137)/(67-97) 129/75 (07/17 0737) SpO2:  [97 %-100 %] 100 % (07/17 0737)  Physical Exam:  General: alert, cooperative and appears stated age Lochia: appropriate Uterine Fundus: firm Incision: healing well, no significant drainage, no dehiscence, no significant erythema DVT Evaluation: No evidence of DVT seen on physical exam.   Recent Labs  03/17/17 0713  HGB 9.7*  HCT 28.6*    Assessment/Plan: Status post Cesarean section. Doing well postoperatively.  Continue current care. Babies still in SCN, planning on breastfeeding  Christeen DouglasBethany Worth Kober 03/17/2017, 8:35 AM

## 2017-03-17 NOTE — Discharge Instructions (Signed)

## 2017-03-17 NOTE — Discharge Summary (Signed)
Obstetrical Discharge Summary  Patient Name: Debra Terry DOB: 1985/09/09 MRN: [id number]  Date of Admission: 03/16/2017 Date of Delivery: 03/16/17 Delivered by: Ranae Plumberhelsea Ward, MD Date of Discharge:03/20/17 Primary OB: [facility]  EXB:[id number]:Patient's last menstrual period was 06/03/2016. EDC Estimated Date of Delivery: 04/04/17 Gestational Age at Delivery: 6064w2d   Antepartum complications:  1. Morbid obesity BMI 43 2. Di-Di twins, concordant growth 3. Preeclampsia dx'd at 33wks 4. RH negative, s/p rhogam @ 28 wks 5. 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 POTS - on metoprolol and sertraline 7. Desired permanent sterilization 8. Thrombocytopenia  Admitting Diagnosis: scheduled cesarean and BTL at 37 weeks due to preeclampsia Secondary Diagnosis: Patient Active Problem List   Diagnosis Date Noted  . Supervision of high risk pregnancy in third trimester 02/27/2017  . Indication for care in labor or delivery 02/09/2017  . Pregnancy, twins, antepartum 02/05/2017  . Labor and delivery indication for care or intervention 01/15/2017  . Pregnancy 01/03/2017  . POTS (postural orthostatic tachycardia syndrome) 08/28/2016  . 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 Diarrhea   . Hematochezia   . Cephalalgia 04/20/2014  . Blurred vision 04/20/2014  . Bleeding from the nose 04/20/2014  . Healthcare maintenance 05/01/2011  . Hand pain 05/01/2011  . Allergic rhinitis   . Back pain 02/07/2011  . Arthralgia of multiple joints 01/23/2011  . Depression   . Migraine headache   . History of pulmonary embolism     Augmentation: n/a Complications: None Intrapartum complications/course:  Date of Delivery:  Delivered By: Ranae Plumberhelsea Ward Delivery Type: repeat cesarean section, low transverse incision Anesthesia: epidural Placenta: sponatneous Laceration:  Episiotomy: none Newborn Data:   Viona GilmoreFlint, Jaliyah Boy A [[id number]][[id number]]  Live born female  Birth  Weight: 6 lb 12.3 oz (3070 g) APGAR: 4, 6   Vinie SillFlint, Mariesha Boy B [[id number]][[id number]]  Live born female  Birth Weight: 6 lb 10.9 oz (3030 g) APGAR: 5, 7  Postpartum Procedures: none  Post partum course:  Patient had an uncomplicated postpartum course.  By time of discharge on POD#4, her pain was controlled on oral pain medications; she had appropriate lochia and was ambulating, voiding without difficulty, tolerating regular diet and passing flatus.   She was deemed stable for discharge to home.    Discharge Physical Exam: 03/20/17 BP 131/79 (BP Location: Right Arm)   Pulse 81   Temp 97.7 F (36.5 C) (Oral)   Resp 17   Ht 5\' 11"  (1.803 m)   Wt (!) 140.6 kg (310 lb)   LMP 06/03/2016   SpO2 100%   Breastfeeding? Unknown   BMI 43.24 kg/m   General: NAD CV: RRR Pulm: CTABL, nl effort ABD: s/nd/nt, fundus firm and below the umbilicus Lochia: moderate Incision: c/d/i, covered in surgical glue DVT Evaluation: LE non-ttp, no evidence of DVT on exam.  Hemoglobin  Date Value Ref Range Status  03/17/2017 9.9 (L) 12.0 - 16.0 g/dL Final   HGB  Date Value Ref Range Status  04/28/2014 15.2 12.0 - 16.0 g/dL Final   HCT  Date Value Ref Range Status  03/17/2017 28.8 (L) 35.0 - 47.0 % Final  04/28/2014 45.2 35.0 - 47.0 % Final     Disposition: stable, discharge to home. Baby Feeding: breastmilkormula Baby Disposition: extended NICU stay  Rh Immune globulin given: yes Rubella vaccine given: no Tdap vaccine given in AP or PP setting: AP Flu vaccine given in AP or PP setting: n/a  Contraception: BTL  Prenatal Labs:   Blood type/Rh O neg  Antibody screen neg  Rubella Immune  Varicella Immune  RPR NR  HBsAg Neg  HIV NR  GC neg  Chlamydia neg  Genetic screening Negative (informaseq)  1 hour GTT Early: 137, 28wk 117  3 hour GTT [id number]  GBS positive   Tdap done  Plan:  Debra Awkward was discharged to home in good condition. [facility] appointment at [facility]  OB/GYN with Dr. Elesa Massed in 2 weeks for initial post op check.   Discharge Medications: Allergies as of 03/20/2017      Reactions   Warfarin Sodium    H/o GI bleed.        Medication List    STOP taking these medications   acetaminophen 500 MG tablet Commonly known as:  TYLENOL   aspirin EC 81 MG tablet   folic acid 400 MCG tablet Commonly known as:  FOLVITE   metoprolol tartrate 50 MG tablet Commonly known as:  LOPRESSOR     TAKE these medications   ibuprofen 600 MG tablet Commonly known as:  ADVIL,MOTRIN Take 1 tablet (600 mg total) by mouth every 6 (six) hours.   labetalol 100 MG tablet Commonly known as:  NORMODYNE Take 1 tablet (100 mg total) by mouth 2 (two) times daily.   multivitamin-prenatal 27-0.8 MG Tabs tablet Take 1 tablet by mouth daily at 12 noon.   oxyCODONE 5 MG immediate release tablet Commonly known as:  Oxy IR/ROXICODONE Take 1 tablet (5 mg total) by mouth every 4 (four) hours as needed (pain scale 4-7).   sertraline 100 MG tablet Commonly known as:  ZOLOFT Take 200 mg by mouth daily.   triamcinolone cream 0.1 % Commonly known as:  KENALOG Apply 1 application topically as needed (rash).       Follow-up Information    Ward, Elenora Fender, MD Follow up in 2 week(s).   Specialty:  Obstetrics and Gynecology Contact information: [address] [place] [address] [phone]           Signed: Jennell Corner MD

## 2017-03-17 NOTE — Anesthesia Post-op Follow-up Note (Signed)
  Anesthesia Pain Follow-up Note  Patient: Debra Terry  Day #: 1  Date of Follow-up: 03/17/2017 Time: 7:36 AM  Last Vitals:  Vitals:   03/16/17 2254 03/17/17 0311  BP: 120/67 137/78  Pulse: 92 85  Resp: 18 18  Temp: 37.3 C 36.9 C    Level of Consciousness: alert  Pain: none   Side Effects:None  Catheter Site Exam:clean, dry, no drainage     Plan: Continue current therapy of postop epidural at surgeon's request  Akon Reinoso,  Sheran FavaMark R

## 2017-03-18 LAB — RHOGAM INJECTION: UNIT DIVISION: 0

## 2017-03-18 MED ORDER — PRENATAL MULTIVITAMIN CH
1.0000 | ORAL_TABLET | Freq: Every day | ORAL | Status: DC
  Administered 2017-03-18 – 2017-03-20 (×3): 1 via ORAL
  Filled 2017-03-18 (×2): qty 1

## 2017-03-18 NOTE — Lactation Note (Signed)
This note was copied from a baby's chart. Lactation Consultation Note  Patient Name: Debra Terry Today's Date: 03/18/2017  I went to help Mom with breastfeeding per RN request once baby was moved to Mom's room. I arrived within minutes, but Mom had already started bottle feeding baby and politely declined consult.  I let her know how I might help her with feeds and pumping and asked if she had questions. She stated she had no questions. I left her my contact information.    Maternal Data    Feeding Feeding Type: Formula Nipple Type: Slow - flow  LATCH Score/Interventions                      Lactation Tools Discussed/Used     Consult Status      Sunday CornSandra Clark Delecia Vastine 03/18/2017, 12:25 PM

## 2017-03-18 NOTE — Progress Notes (Signed)
Subjective: Postpartum Day 2: Cesarean Delivery Patient reports no problems voiding.   Twins in SCN  Objective: Vital signs in last 24 hours: Temp:  [97.7 F (36.5 C)-98.2 F (36.8 C)] 98.1 F (36.7 C) (07/18 0438) Pulse Rate:  [84-88] 88 (07/17 1930) Resp:  [18] 18 (07/17 1930) BP: (114-118)/(64-74) 114/74 (07/17 1930) SpO2:  [100 %] 100 % (07/17 1930)  Physical Exam:  General: alert and cooperative Lochia: appropriate Uterine Fundus: firm Incision: healing well DVT Evaluation: No evidence of DVT seen on physical exam. Lungs CTA  CV RRR  Recent Labs  03/17/17 0713 03/17/17 1829  HGB 9.7* 9.9*  HCT 28.6* 28.8*    Assessment/Plan: Status post Cesarean section. Doing well postoperatively.  Continue current care. D/c tomorrow  Ihor Austinhomas J Cyana Shook 03/18/2017, 8:45 AM

## 2017-03-19 NOTE — Progress Notes (Signed)
Pt and RN discussing who the nurse is for today and who the rounding doctor is for today; the same nurse from Wednesday "day shift" is back today and Dr. Elesa MassedWard is on for today; pt glad for both of those; pt ok with "report done outside my room if I'm asleep"; RN offered that if the pt is awake at 0645, please hit her call bell and the RN will do shift change at the bedside; if the pt does NOT hit the call bell then she is asleep and it's ok for RNs to do shift change outside her room

## 2017-03-19 NOTE — Lactation Note (Signed)
This note was copied from a baby's chart. Lactation Consultation Note  Patient Name: Debra Terry Debra Terry: 03/19/2017 Reason for consult: Initial assessment   Maternal Data Formula Feeding for Exclusion: Yes Has patient been taught Hand Expression?: Yes Does the patient have breastfeeding experience prior to this delivery?: Yes  Feeding    LATCH Score/Interventions     Reviewed a plan with mother for feeding her twins. Her previous breast feeding experience was as she stated "stressful" and she had post partem depression.   This time with the twins we discussed offering the baby the breast once a day at a time that the baby is most awake and alert. She will pump every feeding and feed the baby supplemental bottle of formula and or pumped milk. She is going to try to pump and breast feed as long as she can and  try to stay healthy.              Lactation Tools Discussed/Used     Consult Status      Debra Terry 03/19/2017, 1:00 PM

## 2017-03-19 NOTE — Lactation Note (Addendum)
This note was copied from a baby's chart. Lactation Consultation Note Mom pumping every 3 hours.  She is alternating breast milk feeds between twins.  One pumping she gives [name], Twin B, in SCN her expressed breast milk.  The next pumping she gives [name], Twin A, her expressed breast milk.  Mom verbalizes nipple tenderness.  No trauma noted to nipples.  Coconut Oil given and demonstrated how to apply to nipples and around flange of pump before pumping.  Encouraged hand expression of breast milk after breast feed and apply to nipples explaining how it has live white blood cells to kill bacteria, fats to lubricate and vitamins to heal.  Reviewed supply and demand and encouraged to put twins to breast and/or pump at least 8 times a day to continue to transition in mature milk and ensure a plentiful milk supply.  Lactation name and number written on white board and encouraged to call for questions, concerns or assistance. Patient Name: Debra Terry [id number]'s Date: 03/19/2017 Reason for consult: Initial assessment   Maternal Data Formula Feeding for Exclusion:  (breast and bottle) Has patient been taught Hand Expression?: Yes  Feeding Feeding Type: Bottle Fed - Breast Milk Nipple Type: Slow - flow  LATCH Score/Interventions                      Lactation Tools Discussed/Used Tools: Other (comment) (Coconut Oil) WIC Program: No (AETNA) Pump Review: Setup, frequency, and cleaning;Milk Storage   Consult Status Consult Status: PRN    Louis MeckelWilliams, Aerion Bagdasarian Kay 03/19/2017, 9:53 PM

## 2017-03-19 NOTE — Progress Notes (Addendum)
Chaplain was just making rounds and stopped in to check on patient. Patient was doing good and was holding one of  her twins. She stated that the other twin was in the NICU.

## 2017-03-20 ENCOUNTER — Other Ambulatory Visit: Payer: Managed Care, Other (non HMO)

## 2017-03-20 MED ORDER — OXYCODONE HCL 5 MG PO TABS: 5.0000 mg | ORAL_TABLET | ORAL | 0 refills | Status: DC | PRN

## 2017-03-20 MED ORDER — IBUPROFEN 600 MG PO TABS: 600.0000 mg | ORAL_TABLET | Freq: Four times a day (QID) | ORAL | 0 refills | Status: DC

## 2017-03-20 MED ORDER — LABETALOL HCL 100 MG PO TABS: 100.0000 mg | ORAL_TABLET | Freq: Two times a day (BID) | ORAL | 0 refills | Status: DC

## 2017-03-20 NOTE — Progress Notes (Signed)
Patient discharged.  While room in room with twin b. Twin A discharged will remain in rm 17 with mom dad and twin bDischarge instructions reviewed with patient.  All questions answered.  Follow up appointment scheduled. Incision kit given to pt

## 2017-03-20 NOTE — Progress Notes (Signed)
  Subjective:  Doing well.  No complaints.  Voiding, ambulating, tolerating regular PO diet, tolerating pain with PO meds. Denies: CP SOB F/C, N/V, calf pain  Denies: HA, visual changes, SOB, or RUQ/epigastric pain   Objective:  Blood pressure 128/68, pulse (!) 110, temperature 97.7 F (36.5 C), temperature source Axillary, resp. rate 18, height 5\' 11"  (1.803 m), weight (!) 140.6 kg (310 lb), last menstrual period 06/03/2016, SpO2 99 %, unknown if currently breastfeeding.  General: NAD Pulmonary: no increased work of breathing Abdomen: non-distended, non-tender, fundus firm at level of umbilicus Incision: Extremities: no edema, no erythema, no tenderness    Assessment:   31 y.o. [id number] postoperativeday # 3 from repeat LTCS and BTL   Plan:  1) Acute blood loss anemia - hemodynamically stable and asymptomatic - po ferrous sulfate  2. Post op - doing well, meeting goals 3. Remain inpatient pending dispo of newborn boys.  ----- Ranae Plumberhelsea Ward, MD Attending Obstetrician and Gynecologist [facility], Department of OB/GYN [facility]

## 2017-10-09 ENCOUNTER — Ambulatory Visit (INDEPENDENT_AMBULATORY_CARE_PROVIDER_SITE_OTHER): Payer: Self-pay | Admitting: Family Medicine

## 2017-10-09 ENCOUNTER — Encounter: Payer: Self-pay | Admitting: Family Medicine

## 2017-10-09 VITALS — BP 138/98 | HR 80 | Ht 70.0 in | Wt 293.0 lb

## 2017-10-09 DIAGNOSIS — G43C0 Periodic headache syndromes in child or adult, not intractable: Secondary | ICD-10-CM

## 2017-10-09 DIAGNOSIS — F329 Major depressive disorder, single episode, unspecified: Secondary | ICD-10-CM

## 2017-10-09 DIAGNOSIS — Z7689 Persons encountering health services in other specified circumstances: Secondary | ICD-10-CM

## 2017-10-09 DIAGNOSIS — I951 Orthostatic hypotension: Secondary | ICD-10-CM

## 2017-10-09 DIAGNOSIS — R030 Elevated blood-pressure reading, without diagnosis of hypertension: Secondary | ICD-10-CM

## 2017-10-09 DIAGNOSIS — R000 Tachycardia, unspecified: Secondary | ICD-10-CM

## 2017-10-09 DIAGNOSIS — G90A Postural orthostatic tachycardia syndrome (POTS): Secondary | ICD-10-CM

## 2017-10-09 MED ORDER — SERTRALINE HCL 100 MG PO TABS: 200.0000 mg | ORAL_TABLET | Freq: Every day | ORAL | 5 refills | Status: DC

## 2017-10-09 MED ORDER — METOPROLOL TARTRATE 100 MG PO TABS: 100.0000 mg | ORAL_TABLET | Freq: Two times a day (BID) | ORAL | 5 refills | Status: DC

## 2017-10-09 NOTE — Progress Notes (Signed)
Name: Debra Terry   MRN: [id number]    DOB: 06-Mar-1986   Date:10/09/2017       Progress Note  Subjective  Chief Complaint  Chief Complaint  Patient presents with  . Establish Care    moving from Dr Arlana Pouch  . neurocargenic syncopy    takes sertraline and metoprolol together for this    HPI  No problem-specific Assessment & Plan notes found for this encounter.   Past Medical History:  Diagnosis Date  . Allergic rhinitis   . Asthma    childhood asthma  . Complication of anesthesia    woke during T&A  . Depression    lost child 08/2011  . Generalized headaches    frequent  . H/O gastric ulcer   . H/O: menorrhagia    to start depo for this  . History of kidney stones   . 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 History of syncope    neurocardiogenic per pt  . Hx: UTI (urinary tract infection)   . Lupus anticoagulant positive    was tested- negative  . Migraine headache    h/o admission at [facility] for this  . Motion sickness    all moving vehicles  . Obesity    lost >100 lbs, now gained back some, previously ran 2 mi in am 1 in pm  . Seasonal allergies   . Wears contact lenses     Past Surgical History:  Procedure Laterality Date  . CESAREAN SECTION     2013 and 2018  . CESAREAN SECTION WITH BILATERAL TUBAL LIGATION N/A 03/16/2017   Procedure: CESAREAN SECTION WITH BILATERAL TUBAL LIGATION;  Surgeon: Ward, Elenora Fender, MD;  Location: [facility] OR[facility];  Service: Obstetrics;  Laterality: N/A;  . COLONOSCOPY WITH PROPOFOL N/A 05/16/2015   Procedure: COLONOSCOPY WITH PROPOFOL;  Surgeon: Midge Minium, MD;  Location: [facility];  Service: Endoscopy;  Laterality: N/A;  . EXPLORATORY LAPAROTOMY  2006   Excessive vaginal bleeding  . EYE SURGERY  1999  . TONSILLECTOMY AND ADENOIDECTOMY  1998    Family History  Problem Relation Age of Onset  . Hypertension Mother   . Diabetes Father   .  Hypertension Father   . Hyperlipidemia Father   . Colitis Father   . Heart disease Father   . Heart disease Maternal Grandmother   . Hypertension Maternal Grandmother   . Heart disease Maternal Grandfather   . Lung cancer Maternal Grandfather   . Alcohol abuse Maternal Grandfather   . Arthritis Maternal Grandfather   . Heart disease Paternal Grandmother   . Hypertension Paternal Grandmother   . Breast cancer Paternal Grandmother   . Cancer Paternal Grandmother   . Heart disease Paternal Grandfather   . Coronary artery disease Paternal Grandfather 43  . Alcohol abuse Paternal Grandfather   . Arthritis Paternal Grandfather   . Stroke Neg Hx     Social History   Socioeconomic History  . Marital status: Married    Spouse name: Not on file  . Number of children: 0  . Years of education: HS  . Highest education level: Not on file  Social Needs  . Financial resource strain: Not on file  . Food insecurity - worry: Not on file  . Food insecurity - inability: Not on file  . Transportation needs - medical: Not on file  . Transportation needs -  non-medical: Not on file  Occupational History  . Occupation: Probation officer    Comment: Associate Professor  Tobacco Use  . Smoking status: Never Smoker  . Smokeless tobacco: Never Used  Substance and Sexual Activity  . Alcohol use: No    Comment: Rare/not during pregnancy  . Drug use: No  . Sexual activity: Yes    Birth control/protection: Surgical  Other Topics Concern  . Not on file  Social History Narrative   Caffeine: 1 cup/day   Lives at home with dad, mom, no inside pets   Pharm tech at [employer]    Allergies  Allergen Reactions  . Warfarin Sodium     H/o GI bleed.      Outpatient Medications Prior to Visit  Medication Sig Dispense Refill  . Prenatal Vit-Fe Fumarate-FA (MULTIVITAMIN-PRENATAL) 27-0.8 MG TABS tablet Take 1 tablet by mouth daily at 12 noon.    . metoprolol tartrate (LOPRESSOR) 50  MG tablet Take 1 tablet by mouth 2 (two) times daily.  5  . sertraline (ZOLOFT) 100 MG tablet Take 200 mg by mouth daily.     Marland Kitchen ibuprofen (ADVIL,MOTRIN) 600 MG tablet Take 1 tablet (600 mg total) by mouth every 6 (six) hours. 30 tablet 0  . labetalol (NORMODYNE) 100 MG tablet Take 1 tablet (100 mg total) by mouth 2 (two) times daily. 60 tablet 0  . oxyCODONE (OXY IR/ROXICODONE) 5 MG immediate release tablet Take 1 tablet (5 mg total) by mouth every 4 (four) hours as needed (pain scale 4-7). 30 tablet 0  . triamcinolone cream (KENALOG) 0.1 % Apply 1 application topically as needed (rash).     No facility-administered medications prior to visit.     Review of Systems  Constitutional: Negative for chills, fever, malaise/fatigue and weight loss.  HENT: Negative for ear discharge, ear pain and sore throat.   Eyes: Negative for blurred vision.  Respiratory: Negative for cough, sputum production, shortness of breath and wheezing.   Cardiovascular: Negative for chest pain, palpitations and leg swelling.  Gastrointestinal: Negative for abdominal pain, blood in stool, constipation, diarrhea, heartburn, melena and nausea.  Genitourinary: Negative for dysuria, frequency, hematuria and urgency.  Musculoskeletal: Negative for back pain, joint pain, myalgias and neck pain.  Skin: Negative for rash.  Neurological: Negative for dizziness, tingling, sensory change, focal weakness and headaches.  Endo/Heme/Allergies: Negative for environmental allergies and polydipsia. Does not bruise/bleed easily.  Psychiatric/Behavioral: Negative for depression and suicidal ideas. The patient is not nervous/anxious and does not have insomnia.      Objective  Vitals:   10/09/17 1420  BP: (!) 138/98  Pulse: 80  Weight: 293 lb (132.9 kg)  Height: 5\' 10"  (1.778 m)    Physical Exam  Constitutional: She is well-developed, well-nourished, and in no distress. No distress.  HENT:  Head: Normocephalic and atraumatic.   Right Ear: External ear normal.  Left Ear: External ear normal.  Nose: Nose normal.  Mouth/Throat: Oropharynx is clear and moist.  Eyes: Conjunctivae and EOM are normal. Pupils are equal, round, and reactive to light. Right eye exhibits no discharge. Left eye exhibits no discharge.  Neck: Normal range of motion. Neck supple. No JVD present. No thyromegaly present.  Cardiovascular: Normal rate, regular rhythm, normal heart sounds and intact distal pulses. Exam reveals no gallop and no friction rub.  No murmur heard. Pulmonary/Chest: Effort normal and breath sounds normal. She has no wheezes. She has no rales.  Abdominal: Soft. Bowel sounds are  normal. She exhibits no mass. There is no tenderness. There is no guarding.  Musculoskeletal: Normal range of motion. She exhibits no edema.  Lymphadenopathy:    She has no cervical adenopathy.  Neurological: She is alert. She has normal reflexes.  Skin: Skin is warm and dry. She is not diaphoretic.  Psychiatric: Mood and affect normal.  Nursing note and vitals reviewed.     Assessment & Plan  Problem List Items Addressed This Visit      Cardiovascular and Mediastinum   Migraine headache   Relevant Medications   sertraline (ZOLOFT) 100 MG tablet   metoprolol tartrate (LOPRESSOR) 100 MG tablet   POTS (postural orthostatic tachycardia syndrome)   Relevant Medications   metoprolol tartrate (LOPRESSOR) 100 MG tablet     Other   Depression   Relevant Medications   sertraline (ZOLOFT) 100 MG tablet    Other Visit Diagnoses    Establishing care with new doctor, encounter for    -  Primary   Elevated blood-pressure reading, without diagnosis of hypertension          Meds ordered this encounter  Medications  . sertraline (ZOLOFT) 100 MG tablet    Sig: Take 2 tablets (200 mg total) by mouth daily.    Dispense:  60 tablet    Refill:  5  . metoprolol tartrate (LOPRESSOR) 100 MG tablet    Sig: Take 1 tablet (100 mg total) by mouth 2  (two) times daily.    Dispense:  60 tablet    Refill:  5      Dr. Elizabeth Sauereanna Kylea Berrong [facility]  10/09/17

## 2018-04-08 ENCOUNTER — Ambulatory Visit: Payer: Self-pay | Admitting: Family Medicine

## 2018-06-21 ENCOUNTER — Encounter: Payer: Self-pay | Admitting: Family Medicine

## 2018-07-11 ENCOUNTER — Other Ambulatory Visit: Payer: Self-pay | Admitting: Family Medicine

## 2018-08-26 ENCOUNTER — Other Ambulatory Visit: Payer: Self-pay | Admitting: Family Medicine

## 2018-10-15 ENCOUNTER — Other Ambulatory Visit: Payer: Self-pay | Admitting: Family Medicine

## 2018-10-28 ENCOUNTER — Other Ambulatory Visit: Payer: Self-pay | Admitting: Family Medicine

## 2018-11-01 ENCOUNTER — Other Ambulatory Visit: Payer: Self-pay | Admitting: Family Medicine

## 2018-11-02 ENCOUNTER — Encounter: Payer: Self-pay | Admitting: Family Medicine

## 2018-11-03 ENCOUNTER — Other Ambulatory Visit: Payer: Self-pay

## 2018-11-03 DIAGNOSIS — R030 Elevated blood-pressure reading, without diagnosis of hypertension: Secondary | ICD-10-CM

## 2018-11-03 DIAGNOSIS — F329 Major depressive disorder, single episode, unspecified: Secondary | ICD-10-CM

## 2018-11-03 MED ORDER — SERTRALINE HCL 100 MG PO TABS: 200.0000 mg | ORAL_TABLET | Freq: Every day | ORAL | 0 refills | Status: DC

## 2018-11-03 MED ORDER — METOPROLOL TARTRATE 100 MG PO TABS: ORAL_TABLET | ORAL | 0 refills | Status: DC

## 2018-11-09 ENCOUNTER — Encounter: Payer: Self-pay | Admitting: Family Medicine

## 2018-11-09 ENCOUNTER — Ambulatory Visit: Payer: Managed Care, Other (non HMO) | Admitting: Family Medicine

## 2018-11-09 VITALS — BP 102/62 | HR 72 | Ht 70.0 in | Wt 306.0 lb

## 2018-11-09 DIAGNOSIS — F329 Major depressive disorder, single episode, unspecified: Secondary | ICD-10-CM

## 2018-11-09 DIAGNOSIS — R030 Elevated blood-pressure reading, without diagnosis of hypertension: Secondary | ICD-10-CM

## 2018-11-09 MED ORDER — SERTRALINE HCL 100 MG PO TABS: 200.0000 mg | ORAL_TABLET | Freq: Every day | ORAL | 1 refills | Status: AC

## 2018-11-09 MED ORDER — METOPROLOL TARTRATE 100 MG PO TABS: ORAL_TABLET | ORAL | 1 refills | Status: AC

## 2018-11-09 NOTE — Progress Notes (Signed)
Date:  11/09/2018   Name:  Debra Terry   DOB:  1986/06/08   MRN:  [id number]   Chief Complaint: Hypertension and Depression (PHQ9=9)  Hypertension  This is a chronic problem. The current episode started more than 1 year ago. The problem is unchanged. The problem is controlled. Associated symptoms include malaise/fatigue. Pertinent negatives include no anxiety, blurred vision, chest pain, headaches, neck pain, orthopnea, palpitations, peripheral edema, PND, shortness of breath or sweats. There are no associated agents to hypertension. There are no known risk factors for coronary artery disease. Past treatments include beta blockers. There are no compliance problems.  There is no history of angina, kidney disease, CAD/MI, CVA, heart failure, left ventricular hypertrophy, PVD or retinopathy. There is no history of a hypertension causing med or renovascular disease.  Depression         This is a chronic problem.  The current episode started more than 1 year ago.   The onset quality is sudden.   The problem has been gradually improving since onset.  Associated symptoms include no decreased concentration, no fatigue, no helplessness, no hopelessness, does not have insomnia, not irritable, no restlessness, no decreased interest, no appetite change, no body aches, no myalgias, no headaches, no indigestion, not sad and no suicidal ideas.     The symptoms are aggravated by nothing.  Past treatments include SSRIs - Selective serotonin reuptake inhibitors.  Compliance with treatment is good.   Pertinent negatives include no anxiety.   Review of Systems  Constitutional: Positive for malaise/fatigue. Negative for appetite change, chills, fatigue, fever and unexpected weight change.  HENT: Negative for congestion, ear discharge, ear pain, rhinorrhea, sinus pressure, sneezing and sore throat.   Eyes: Negative for blurred vision, photophobia, pain, discharge, redness and itching.  Respiratory: Negative for  cough, shortness of breath, wheezing and stridor.   Cardiovascular: Negative for chest pain, palpitations, orthopnea and PND.  Gastrointestinal: Negative for abdominal pain, blood in stool, constipation, diarrhea, nausea and vomiting.  Endocrine: Negative for cold intolerance, heat intolerance, polydipsia, polyphagia and polyuria.  Genitourinary: Negative for dysuria, flank pain, frequency, hematuria, menstrual problem, pelvic pain, urgency, vaginal bleeding and vaginal discharge.  Musculoskeletal: Negative for arthralgias, back pain, myalgias and neck pain.  Skin: Negative for rash.  Allergic/Immunologic: Negative for environmental allergies and food allergies.  Neurological: Negative for dizziness, weakness, light-headedness, numbness and headaches.  Hematological: Negative for adenopathy. Does not bruise/bleed easily.  Psychiatric/Behavioral: Positive for depression. Negative for decreased concentration, dysphoric mood and suicidal ideas. The patient is not nervous/anxious and does not have insomnia.     Patient Active Problem List   Diagnosis Date Noted  . Supervision of high risk pregnancy in third trimester 02/27/2017  . Mild preeclampsia, third trimester 02/19/2017  . Indication for care in labor or delivery 02/09/2017  . Pregnancy, twins, antepartum 02/05/2017  . Labor and delivery indication for care or intervention 01/15/2017  . Pregnancy 01/03/2017  . History of migraine headaches 09/11/2016  . Rh negative status during pregnancy in first trimester 09/04/2016  . POTS (postural orthostatic tachycardia syndrome) 08/28/2016  . 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 Diarrhea   . Hematochezia   . Cephalalgia 04/20/2014  . Blurred vision 04/20/2014  . Bleeding from the nose 04/20/2014  . Healthcare maintenance 05/01/2011  . Hand pain 05/01/2011  . Allergic rhinitis   . Back pain 02/07/2011  . Arthralgia of multiple joints  01/23/2011  . Depression   .  Migraine headache   . History of pulmonary embolism     Allergies  Allergen Reactions  . Warfarin Sodium     H/o GI bleed.      Past Surgical History:  Procedure Laterality Date  . CESAREAN SECTION     2013 and 2018  . CESAREAN SECTION WITH BILATERAL TUBAL LIGATION N/A 03/16/2017   Procedure: CESAREAN SECTION WITH BILATERAL TUBAL LIGATION;  Surgeon: Ward, Elenora Fender, MD;  Location: [facility];  Service: Obstetrics;  Laterality: N/A;  . COLONOSCOPY WITH PROPOFOL N/A 05/16/2015   Procedure: COLONOSCOPY WITH PROPOFOL;  Surgeon: Midge Minium, MD;  Location: [facility];  Service: Endoscopy;  Laterality: N/A;  . EXPLORATORY LAPAROTOMY  2006   Excessive vaginal bleeding  . EYE SURGERY  1999  . TONSILLECTOMY AND ADENOIDECTOMY  1998    Social History   Tobacco Use  . Smoking status: Never Smoker  . Smokeless tobacco: Never Used  Substance Use Topics  . Alcohol use: No    Comment: Rare/not during pregnancy  . Drug use: No     Medication list has been reviewed and updated.  Current Meds  Medication Sig  . metoprolol tartrate (LOPRESSOR) 100 MG tablet TAKE 1 TABLET(100 MG) BY MOUTH TWICE DAILY  . Prenatal Vit-Fe Fumarate-FA (MULTIVITAMIN-PRENATAL) 27-0.8 MG TABS tablet Take 1 tablet by mouth daily at 12 noon.  . sertraline (ZOLOFT) 100 MG tablet Take 2 tablets (200 mg total) by mouth daily.    PHQ 2/9 Scores 11/09/2018 10/09/2017  PHQ - 2 Score 0 1  PHQ- 9 Score 9 3    Physical Exam Nursing note reviewed.  Constitutional:      General: She is not irritable.She is not in acute distress.    Appearance: She is not diaphoretic.  HENT:     Head: Normocephalic and atraumatic.     Right Ear: External ear normal.     Left Ear: External ear normal.     Nose: Nose normal.  Eyes:     General:        Right eye: No discharge.        Left eye: No discharge.     Conjunctiva/sclera: Conjunctivae normal.     Pupils: Pupils are equal, round, and  reactive to light.  Neck:     Musculoskeletal: Normal range of motion and neck supple.     Thyroid: No thyromegaly.     Vascular: No JVD.  Cardiovascular:     Rate and Rhythm: Normal rate and regular rhythm.     Heart sounds: Normal heart sounds. No murmur. No friction rub. No gallop.   Pulmonary:     Effort: Pulmonary effort is normal. No respiratory distress.     Breath sounds: Normal breath sounds. No stridor. No wheezing, rhonchi or rales.  Abdominal:     General: Bowel sounds are normal.     Palpations: Abdomen is soft. There is no mass.     Tenderness: There is no abdominal tenderness. There is no guarding.  Musculoskeletal: Normal range of motion.  Lymphadenopathy:     Cervical: No cervical adenopathy.  Skin:    General: Skin is warm and dry.  Neurological:     Mental Status: She is alert.     Deep Tendon Reflexes: Reflexes are normal and symmetric.     Wt Readings from Last 3 Encounters:  11/09/18 (!) 306 lb (138.8 kg)  10/09/17 293 lb (132.9 kg)  03/16/17 (!) 310 lb (140.6 kg)    BP 102/62  Pulse 72   Ht 5\' 10"  (1.778 m)   Wt (!) 306 lb (138.8 kg)   BMI 43.91 kg/m   Assessment and Plan: 1. Reactive depression Chronic. PHQ9=9. Will refill sertraline and recheck in 6 weeks. If not better, next step psychiatry.  - sertraline (ZOLOFT) 100 MG tablet; Take 2 tablets (200 mg total) by mouth daily.  Dispense: 180 tablet; Refill: 1  2. Elevated blood-pressure reading, without diagnosis of hypertension Chronic. Stable on med. Refill metoprolol 100mg  bid/ draw labs at next visit. - metoprolol tartrate (LOPRESSOR) 100 MG tablet; TAKE 1 TABLET(100 MG) BY MOUTH TWICE DAILY  Dispense: 180 tablet; Refill: 1

## 2018-11-09 NOTE — Patient Instructions (Signed)

## 2018-11-10 ENCOUNTER — Encounter: Payer: Self-pay | Admitting: Family Medicine

## 2019-01-20 ENCOUNTER — Encounter: Payer: Self-pay | Admitting: Family Medicine

## 2019-03-02 ENCOUNTER — Encounter
Admission: RE | Admit: 2019-03-02 | Discharge: 2019-03-02 | Disposition: A | Discharge status: Home / Self Care | Payer: Managed Care, Other (non HMO) | Source: Ambulatory Visit | Attending: Obstetrics & Gynecology | Admitting: Obstetrics & Gynecology

## 2019-03-02 ENCOUNTER — Other Ambulatory Visit: Payer: Self-pay

## 2019-03-02 DIAGNOSIS — R9431 Abnormal electrocardiogram [ECG] [EKG]: Secondary | ICD-10-CM | POA: Insufficient documentation

## 2019-03-02 DIAGNOSIS — I498 Other specified cardiac arrhythmias: Secondary | ICD-10-CM | POA: Insufficient documentation

## 2019-03-02 DIAGNOSIS — Z01810 Encounter for preprocedural cardiovascular examination: Secondary | ICD-10-CM | POA: Insufficient documentation

## 2019-03-02 HISTORY — DX: Postural orthostatic tachycardia syndrome (POTS): G90.A

## 2019-03-02 HISTORY — DX: Other specified cardiac arrhythmias: I49.8

## 2019-03-02 NOTE — Patient Instructions (Signed)
Your procedure is scheduled on:03/11/19 Report to Day Surgery.[facility]OND FLOOR To find out your arrival time please call [phone] between 1PM - 3PM on 03/10/19.  Remember: Instructions that are not followed completely may result in serious medical risk,  up to and including death, or upon the discretion of your surgeon and anesthesiologist your  surgery may need to be rescheduled.     _X__ 1. Do not eat food after midnight the night before your procedure.                 No gum chewing or hard candies. You may drink clear liquids up to 2 hours                 before you are scheduled to arrive for your surgery- DO not drink clear                 liquids within 2 hours of the start of your surgery.                 Clear Liquids include:  water, apple juice without pulp, clear carbohydrate                 drink such as Clearfast of Gatorade, Black Coffee or Tea (Do not add                 anything to coffee or tea).  __X__2.  On the morning of surgery brush your teeth with toothpaste and water, you                may rinse your mouth with mouthwash if you wish.  Do not swallow any toothpaste of mouthwash.     _X__ 3.  No Alcohol for 24 hours before or after surgery.   _X__ 4.  Do Not Smoke or use e-cigarettes For 24 Hours Prior to Your Surgery.                 Do not use any chewable tobacco products for at least 6 hours prior to                 surgery.  ____  5.  Bring all medications with you on the day of surgery if instructed.   __X__  6.  Notify your doctor if there is any change in your medical condition      (cold, fever, infections).     Do not wear jewelry, make-up, hairpins, clips or nail polish. Do not wear lotions, powders, or perfumes. You may wear deodorant. Do not shave 48 hours prior to surgery. Men may shave face and neck. Do not bring valuables to the hospital.    [facility] is not responsible for any belongings or  valuables.  Contacts, dentures or bridgework may not be worn into surgery. Leave your suitcase in the car. After surgery it may be brought to your room. For patients admitted to the hospital, discharge time is determined by your treatment team.   Patients discharged the day of surgery will not be allowed to drive home.   Please read over the following fact sheets that you were given:   Surgical Site Infection Prevention          _X___ Take these medicines the morning of surgery with A SIP OF WATER:    1. BUPROPION  2. METOPROLOL  3. SERTRALINE  4.  5.  6.  ____ Fleet Enema (as directed)   _X___ Use  CHG Soap as directed  ____ Use inhalers on the day of surgery  ____ Stop metformin 2 days prior to surgery    ____ Take 1/2 of usual insulin dose the night before surgery. No insulin the morning          of surgery.   ____ Stop Coumadin/Plavix/aspirin on   ____ Stop Anti-inflammatories on    ____ Stop supplements until after surgery.    ____ Bring C-Pap to the hospital.

## 2019-03-02 NOTE — Pre-Procedure Instructions (Signed)
EKG COMPARED WITH 2018. KNOWN POTS SINCE AGE 33

## 2019-03-08 ENCOUNTER — Other Ambulatory Visit: Payer: Self-pay

## 2019-03-08 ENCOUNTER — Other Ambulatory Visit
Admission: RE | Admit: 2019-03-08 | Discharge: 2019-03-08 | Disposition: A | Discharge status: Home / Self Care | Payer: Managed Care, Other (non HMO) | Source: Ambulatory Visit | Attending: Obstetrics & Gynecology | Admitting: Obstetrics & Gynecology

## 2019-03-08 DIAGNOSIS — Z01812 Encounter for preprocedural laboratory examination: Secondary | ICD-10-CM | POA: Insufficient documentation

## 2019-03-08 DIAGNOSIS — N938 Other specified abnormal uterine and vaginal bleeding: Secondary | ICD-10-CM | POA: Insufficient documentation

## 2019-03-08 DIAGNOSIS — Z1159 Encounter for screening for other viral diseases: Secondary | ICD-10-CM | POA: Insufficient documentation

## 2019-03-08 LAB — BASIC METABOLIC PANEL
Anion gap: 10 (ref 5–15)
BUN: 15 mg/dL (ref 6–20)
CO2: 22 mmol/L (ref 22–32)
Calcium: 8.9 mg/dL (ref 8.9–10.3)
Chloride: 107 mmol/L (ref 98–111)
Creatinine, Ser: 0.65 mg/dL (ref 0.44–1.00)
GFR calc Af Amer: >60 mL/min (ref >60)
GFR calc non Af Amer: >60 mL/min (ref >60)
Glucose, Bld: 121 mg/dL — ABNORMAL HIGH (ref 70–99)
Potassium: 3.8 mmol/L (ref 3.5–5.1)
Sodium: 139 mmol/L (ref 135–145)

## 2019-03-08 LAB — CBC
HCT: 45.5 % (ref 36.0–46.0)
Hemoglobin: 15 g/dL (ref 12.0–15.0)
MCH: 30.2 pg (ref 26.0–34.0)
MCHC: 33 g/dL (ref 30.0–36.0)
MCV: 91.7 fL (ref 80.0–100.0)
Platelets: 161 10*3/uL (ref 150–400)
RBC: 4.96 MIL/uL (ref 3.87–5.11)
RDW: 13.6 % (ref 11.5–15.5)
WBC: 6.8 10*3/uL (ref 4.0–10.5)
nRBC: 0 % (ref 0.0–0.2)

## 2019-03-08 LAB — TYPE AND SCREEN
ABO/RH(D): O NEG
Antibody Screen: NEGATIVE

## 2019-03-08 LAB — SARS CORONAVIRUS 2 (TAT 6-24 HRS): SARS Coronavirus 2: NEGATIVE

## 2019-03-10 MED ORDER — DEXTROSE 5 % IV SOLN
3.0000 g | INTRAVENOUS | Status: AC
  Administered 2019-03-11: 14:00:00 3 g via INTRAVENOUS
  Filled 2019-03-10: qty 3

## 2019-03-11 ENCOUNTER — Ambulatory Visit: Payer: Managed Care, Other (non HMO) | Admitting: Anesthesiology

## 2019-03-11 ENCOUNTER — Other Ambulatory Visit: Payer: Self-pay

## 2019-03-11 ENCOUNTER — Ambulatory Visit
Admission: RE | Admit: 2019-03-11 | Discharge: 2019-03-11 | Disposition: A | Discharge status: Home / Self Care | Payer: Managed Care, Other (non HMO) | Attending: Obstetrics & Gynecology | Admitting: Obstetrics & Gynecology

## 2019-03-11 ENCOUNTER — Encounter
Admission: RE | Disposition: A | Discharge status: Home / Self Care | Payer: Self-pay | Source: Home / Self Care | Attending: Obstetrics & Gynecology

## 2019-03-11 ENCOUNTER — Encounter: Payer: Self-pay | Admitting: *Deleted

## 2019-03-11 DIAGNOSIS — Z8261 Family history of arthritis: Secondary | ICD-10-CM | POA: Diagnosis not present

## 2019-03-11 DIAGNOSIS — R55 Syncope and collapse: Secondary | ICD-10-CM | POA: Diagnosis not present

## 2019-03-11 DIAGNOSIS — Z833 Family history of diabetes mellitus: Secondary | ICD-10-CM | POA: Diagnosis not present

## 2019-03-11 DIAGNOSIS — Z801 Family history of malignant neoplasm of trachea, bronchus and lung: Secondary | ICD-10-CM | POA: Insufficient documentation

## 2019-03-11 DIAGNOSIS — R197 Diarrhea, unspecified: Secondary | ICD-10-CM | POA: Insufficient documentation

## 2019-03-11 DIAGNOSIS — Z87440 Personal history of urinary (tract) infections: Secondary | ICD-10-CM | POA: Insufficient documentation

## 2019-03-11 DIAGNOSIS — Z86711 Personal history of pulmonary embolism: Secondary | ICD-10-CM | POA: Diagnosis not present

## 2019-03-11 DIAGNOSIS — F329 Major depressive disorder, single episode, unspecified: Secondary | ICD-10-CM | POA: Diagnosis not present

## 2019-03-11 DIAGNOSIS — I10 Essential (primary) hypertension: Secondary | ICD-10-CM | POA: Insufficient documentation

## 2019-03-11 DIAGNOSIS — Z6841 Body Mass Index (BMI) 40.0 and over, adult: Secondary | ICD-10-CM | POA: Diagnosis not present

## 2019-03-11 DIAGNOSIS — Z803 Family history of malignant neoplasm of breast: Secondary | ICD-10-CM | POA: Insufficient documentation

## 2019-03-11 DIAGNOSIS — Z8711 Personal history of peptic ulcer disease: Secondary | ICD-10-CM | POA: Diagnosis not present

## 2019-03-11 DIAGNOSIS — Z8379 Family history of other diseases of the digestive system: Secondary | ICD-10-CM | POA: Diagnosis not present

## 2019-03-11 DIAGNOSIS — Z87442 Personal history of urinary calculi: Secondary | ICD-10-CM | POA: Insufficient documentation

## 2019-03-11 DIAGNOSIS — N800 Endometriosis of uterus: Secondary | ICD-10-CM | POA: Diagnosis not present

## 2019-03-11 DIAGNOSIS — E669 Obesity, unspecified: Secondary | ICD-10-CM | POA: Insufficient documentation

## 2019-03-11 DIAGNOSIS — Z809 Family history of malignant neoplasm, unspecified: Secondary | ICD-10-CM | POA: Insufficient documentation

## 2019-03-11 DIAGNOSIS — Z811 Family history of alcohol abuse and dependence: Secondary | ICD-10-CM | POA: Insufficient documentation

## 2019-03-11 DIAGNOSIS — Z79899 Other long term (current) drug therapy: Secondary | ICD-10-CM | POA: Insufficient documentation

## 2019-03-11 DIAGNOSIS — Z8249 Family history of ischemic heart disease and other diseases of the circulatory system: Secondary | ICD-10-CM | POA: Diagnosis not present

## 2019-03-11 DIAGNOSIS — G43909 Migraine, unspecified, not intractable, without status migrainosus: Secondary | ICD-10-CM | POA: Diagnosis not present

## 2019-03-11 DIAGNOSIS — N938 Other specified abnormal uterine and vaginal bleeding: Secondary | ICD-10-CM | POA: Insufficient documentation

## 2019-03-11 HISTORY — PX: LAPAROSCOPIC HYSTERECTOMY: SHX1926

## 2019-03-11 LAB — POCT PREGNANCY, URINE: Preg Test, Ur: NEGATIVE

## 2019-03-11 SURGERY — HYSTERECTOMY, TOTAL, LAPAROSCOPIC
Anesthesia: General

## 2019-03-11 MED ORDER — SOD CITRATE-CITRIC ACID 500-334 MG/5ML PO SOLN
30.0000 mL | ORAL | Status: DC
  Filled 2019-03-11: qty 30

## 2019-03-11 MED ORDER — LIDOCAINE HCL (CARDIAC) PF 100 MG/5ML IV SOSY
PREFILLED_SYRINGE | INTRAVENOUS | Status: DC | PRN
  Administered 2019-03-11: 100 mg via INTRAVENOUS

## 2019-03-11 MED ORDER — GABAPENTIN 300 MG PO CAPS
ORAL_CAPSULE | ORAL | Status: AC
  Administered 2019-03-11: 11:00:00 600 mg via ORAL
  Filled 2019-03-11: qty 1

## 2019-03-11 MED ORDER — FAMOTIDINE 20 MG PO TABS
20.0000 mg | ORAL_TABLET | Freq: Once | ORAL | Status: AC
  Administered 2019-03-11: 11:00:00 20 mg via ORAL

## 2019-03-11 MED ORDER — FENTANYL CITRATE (PF) 100 MCG/2ML IJ SOLN
25.0000 ug | INTRAMUSCULAR | Status: DC | PRN
  Administered 2019-03-11 (×4): 25 ug via INTRAVENOUS

## 2019-03-11 MED ORDER — DEXAMETHASONE SODIUM PHOSPHATE 10 MG/ML IJ SOLN
4.0000 mg | INTRAMUSCULAR | Status: AC
  Administered 2019-03-11: 4 mg via INTRAVENOUS

## 2019-03-11 MED ORDER — FAMOTIDINE 20 MG PO TABS
ORAL_TABLET | ORAL | Status: AC
  Administered 2019-03-11: 11:00:00 20 mg via ORAL
  Filled 2019-03-11: qty 1

## 2019-03-11 MED ORDER — FENTANYL CITRATE (PF) 100 MCG/2ML IJ SOLN
INTRAMUSCULAR | Status: AC
  Filled 2019-03-11: qty 2

## 2019-03-11 MED ORDER — DEXAMETHASONE SODIUM PHOSPHATE 10 MG/ML IJ SOLN
INTRAMUSCULAR | Status: DC | PRN
  Administered 2019-03-11: 10 mg via INTRAVENOUS

## 2019-03-11 MED ORDER — MIDAZOLAM HCL 2 MG/2ML IJ SOLN
INTRAMUSCULAR | Status: AC
  Filled 2019-03-11: qty 2

## 2019-03-11 MED ORDER — CELECOXIB 200 MG PO CAPS
400.0000 mg | ORAL_CAPSULE | ORAL | Status: AC
  Administered 2019-03-11: 400 mg via ORAL

## 2019-03-11 MED ORDER — SCOPOLAMINE 1 MG/3DAYS TD PT72
1.0000 | MEDICATED_PATCH | TRANSDERMAL | Status: DC
  Administered 2019-03-11: 1.5 mg via TRANSDERMAL

## 2019-03-11 MED ORDER — LACTATED RINGERS IV SOLN
INTRAVENOUS | Status: DC | PRN
  Administered 2019-03-11: 14:00:00 via INTRAVENOUS

## 2019-03-11 MED ORDER — ACETAMINOPHEN 325 MG PO TABS: 650.0000 mg | ORAL_TABLET | ORAL | Status: DC | PRN

## 2019-03-11 MED ORDER — SUGAMMADEX SODIUM 500 MG/5ML IV SOLN
INTRAVENOUS | Status: DC | PRN
  Administered 2019-03-11: 500 mg via INTRAVENOUS

## 2019-03-11 MED ORDER — BUPIVACAINE HCL (PF) 0.5 % IJ SOLN
INTRAMUSCULAR | Status: AC
  Filled 2019-03-11: qty 30

## 2019-03-11 MED ORDER — ENOXAPARIN SODIUM 40 MG/0.4ML ~~LOC~~ SOLN
40.0000 mg | SUBCUTANEOUS | Status: AC
  Administered 2019-03-11: 40 mg via SUBCUTANEOUS
  Filled 2019-03-11 (×2): qty 0.4

## 2019-03-11 MED ORDER — DEXMEDETOMIDINE HCL 200 MCG/2ML IV SOLN
INTRAVENOUS | Status: DC | PRN
  Administered 2019-03-11: 16 ug via INTRAVENOUS

## 2019-03-11 MED ORDER — OXYCODONE HCL 5 MG PO TABS
5.0000 mg | ORAL_TABLET | ORAL | Status: DC | PRN
  Administered 2019-03-11: 5 mg via ORAL

## 2019-03-11 MED ORDER — ACETAMINOPHEN 650 MG RE SUPP
650.0000 mg | RECTAL | Status: DC | PRN
  Filled 2019-03-11: qty 1

## 2019-03-11 MED ORDER — EPHEDRINE SULFATE 50 MG/ML IJ SOLN
INTRAMUSCULAR | Status: DC | PRN
  Administered 2019-03-11 (×4): 5 mg via INTRAVENOUS

## 2019-03-11 MED ORDER — KETOROLAC TROMETHAMINE 30 MG/ML IJ SOLN
INTRAMUSCULAR | Status: AC
  Filled 2019-03-11: qty 1

## 2019-03-11 MED ORDER — ENOXAPARIN SODIUM 40 MG/0.4ML ~~LOC~~ SOLN: 40.0000 mg | SUBCUTANEOUS | 0 refills | Status: DC

## 2019-03-11 MED ORDER — PROPOFOL 10 MG/ML IV BOLUS
INTRAVENOUS | Status: AC
  Filled 2019-03-11: qty 20

## 2019-03-11 MED ORDER — ACETAMINOPHEN 500 MG PO TABS
1000.0000 mg | ORAL_TABLET | ORAL | Status: AC
  Administered 2019-03-11: 11:00:00 1000 mg via ORAL

## 2019-03-11 MED ORDER — ONDANSETRON HCL 4 MG/2ML IJ SOLN
INTRAMUSCULAR | Status: DC | PRN
  Administered 2019-03-11: 4 mg via INTRAVENOUS

## 2019-03-11 MED ORDER — ESMOLOL HCL 100 MG/10ML IV SOLN
INTRAVENOUS | Status: AC
  Filled 2019-03-11: qty 10

## 2019-03-11 MED ORDER — FENTANYL CITRATE (PF) 250 MCG/5ML IJ SOLN
INTRAMUSCULAR | Status: AC
  Filled 2019-03-11: qty 5

## 2019-03-11 MED ORDER — CELECOXIB 200 MG PO CAPS
ORAL_CAPSULE | ORAL | Status: AC
  Administered 2019-03-11: 400 mg via ORAL
  Filled 2019-03-11: qty 1

## 2019-03-11 MED ORDER — IBUPROFEN 800 MG PO TABS: 800.0000 mg | ORAL_TABLET | Freq: Four times a day (QID) | ORAL | 1 refills | Status: AC

## 2019-03-11 MED ORDER — LACTATED RINGERS IV SOLN: INTRAVENOUS | Status: DC

## 2019-03-11 MED ORDER — ROCURONIUM BROMIDE 100 MG/10ML IV SOLN
INTRAVENOUS | Status: DC | PRN
  Administered 2019-03-11: 45 mg via INTRAVENOUS
  Administered 2019-03-11: 5 mg via INTRAVENOUS
  Administered 2019-03-11: 30 mg via INTRAVENOUS

## 2019-03-11 MED ORDER — SCOPOLAMINE 1 MG/3DAYS TD PT72
MEDICATED_PATCH | TRANSDERMAL | Status: AC
  Administered 2019-03-11: 11:00:00 1.5 mg via TRANSDERMAL
  Filled 2019-03-11: qty 1

## 2019-03-11 MED ORDER — HYDROMORPHONE HCL 1 MG/ML IJ SOLN
INTRAMUSCULAR | Status: DC | PRN
  Administered 2019-03-11 (×2): 0.5 mg via INTRAVENOUS

## 2019-03-11 MED ORDER — BUPIVACAINE HCL (PF) 0.5 % IJ SOLN
INTRAMUSCULAR | Status: DC | PRN
  Administered 2019-03-11: 10 mL

## 2019-03-11 MED ORDER — MIDAZOLAM HCL 2 MG/2ML IJ SOLN
INTRAMUSCULAR | Status: DC | PRN
  Administered 2019-03-11: 2 mg via INTRAVENOUS

## 2019-03-11 MED ORDER — ONDANSETRON HCL 4 MG/2ML IJ SOLN: 4.0000 mg | Freq: Once | INTRAMUSCULAR | Status: DC | PRN

## 2019-03-11 MED ORDER — OXYCODONE HCL 5 MG PO TABS: 5.0000 mg | ORAL_TABLET | ORAL | 0 refills | Status: DC | PRN

## 2019-03-11 MED ORDER — KETOROLAC TROMETHAMINE 30 MG/ML IJ SOLN
30.0000 mg | Freq: Four times a day (QID) | INTRAMUSCULAR | Status: DC
  Administered 2019-03-11: 30 mg via INTRAVENOUS
  Filled 2019-03-11: qty 1

## 2019-03-11 MED ORDER — LACTATED RINGERS IV SOLN
INTRAVENOUS | Status: DC
  Administered 2019-03-11: 12:00:00 via INTRAVENOUS

## 2019-03-11 MED ORDER — OXYCODONE HCL 5 MG PO TABS
ORAL_TABLET | ORAL | Status: AC
  Filled 2019-03-11: qty 1

## 2019-03-11 MED ORDER — SUCCINYLCHOLINE CHLORIDE 20 MG/ML IJ SOLN
INTRAMUSCULAR | Status: DC | PRN
  Administered 2019-03-11: 180 mg via INTRAVENOUS

## 2019-03-11 MED ORDER — DEXAMETHASONE SODIUM PHOSPHATE 10 MG/ML IJ SOLN
INTRAMUSCULAR | Status: AC
  Administered 2019-03-11: 12:00:00 4 mg via INTRAVENOUS
  Filled 2019-03-11: qty 1

## 2019-03-11 MED ORDER — HYDROMORPHONE HCL 1 MG/ML IJ SOLN
INTRAMUSCULAR | Status: AC
  Filled 2019-03-11: qty 1

## 2019-03-11 MED ORDER — PHENYLEPHRINE HCL (PRESSORS) 10 MG/ML IV SOLN: INTRAVENOUS | Status: DC | PRN

## 2019-03-11 MED ORDER — ACETAMINOPHEN 500 MG PO TABS
ORAL_TABLET | ORAL | Status: AC
  Administered 2019-03-11: 1000 mg via ORAL
  Filled 2019-03-11: qty 2

## 2019-03-11 MED ORDER — MORPHINE SULFATE (PF) 4 MG/ML IV SOLN: 1.0000 mg | INTRAVENOUS | Status: DC | PRN

## 2019-03-11 MED ORDER — ONDANSETRON HCL 4 MG/2ML IJ SOLN
INTRAMUSCULAR | Status: AC
  Filled 2019-03-11: qty 2

## 2019-03-11 MED ORDER — FENTANYL CITRATE (PF) 100 MCG/2ML IJ SOLN
INTRAMUSCULAR | Status: DC | PRN
  Administered 2019-03-11: 50 ug via INTRAVENOUS

## 2019-03-11 MED ORDER — PROPOFOL 10 MG/ML IV BOLUS
INTRAVENOUS | Status: DC | PRN
  Administered 2019-03-11: 200 mg via INTRAVENOUS

## 2019-03-11 MED ORDER — GABAPENTIN 300 MG PO CAPS
600.0000 mg | ORAL_CAPSULE | ORAL | Status: AC
  Administered 2019-03-11: 11:00:00 600 mg via ORAL

## 2019-03-11 SURGICAL SUPPLY — 59 items
ADH SKN CLS APL DERMABOND .7 (GAUZE/BANDAGES/DRESSINGS) ×1
APL PRP STRL LF DISP 70% ISPRP (MISCELLANEOUS) ×2
BACTOSHIELD CHG 4% 4OZ (MISCELLANEOUS)
BAG URINE DRAINAGE (UROLOGICAL SUPPLIES) ×3 IMPLANT
BLADE SURG SZ11 CARB STEEL (BLADE) ×3 IMPLANT
CANISTER SUCT 1200ML W/VALVE (MISCELLANEOUS) ×3 IMPLANT
CATH FOLEY 2WAY  5CC 16FR (CATHETERS) ×2
CATH FOLEY 2WAY 5CC 16FR (CATHETERS) ×1
CATH URTH 16FR FL 2W BLN LF (CATHETERS) ×1 IMPLANT
CHLORAPREP W/TINT 26 (MISCELLANEOUS) ×6 IMPLANT
COUNTER NEEDLE 20/40 LG (NEEDLE) ×3 IMPLANT
COVER WAND RF STERILE (DRAPES) ×3 IMPLANT
DEFOGGER SCOPE WARMER CLEARIFY (MISCELLANEOUS) ×3 IMPLANT
DERMABOND ADVANCED (GAUZE/BANDAGES/DRESSINGS) ×2
DERMABOND ADVANCED .7 DNX12 (GAUZE/BANDAGES/DRESSINGS) ×1 IMPLANT
DEVICE SUTURE ENDOST 10MM (ENDOMECHANICALS) IMPLANT
DRAPE LEGGINS SURG 28X43 STRL (DRAPES) ×3 IMPLANT
DRAPE SHEET LG 3/4 BI-LAMINATE (DRAPES) ×3 IMPLANT
DRAPE UNDER BUTTOCK W/FLU (DRAPES) ×3 IMPLANT
ELECT REM PT RETURN 9FT ADLT (ELECTROSURGICAL) ×3
ELECTRODE REM PT RTRN 9FT ADLT (ELECTROSURGICAL) ×1 IMPLANT
GAUZE SPONGE 4X4 16PLY XRAY LF (GAUZE/BANDAGES/DRESSINGS) ×3 IMPLANT
GLOVE PI ORTHOPRO 6.5 (GLOVE) ×2
GLOVE PI ORTHOPRO STRL 6.5 (GLOVE) ×1 IMPLANT
GLOVE SURG SYN 6.5 ES PF (GLOVE) ×9 IMPLANT
GLOVE SURG SYN 6.5 PF PI (GLOVE) ×3 IMPLANT
GOWN STRL REUS W/ TWL LRG LVL3 (GOWN DISPOSABLE) ×3 IMPLANT
GOWN STRL REUS W/ TWL XL LVL3 (GOWN DISPOSABLE) ×1 IMPLANT
GOWN STRL REUS W/TWL LRG LVL3 (GOWN DISPOSABLE) ×12
GOWN STRL REUS W/TWL XL LVL3 (GOWN DISPOSABLE) ×3
IRRIGATION STRYKERFLOW (MISCELLANEOUS) IMPLANT
IRRIGATOR STRYKERFLOW (MISCELLANEOUS)
IV LACTATED RINGERS 1000ML (IV SOLUTION) IMPLANT
KIT PINK PAD W/HEAD ARE REST (MISCELLANEOUS) ×3
KIT PINK PAD W/HEAD ARM REST (MISCELLANEOUS) ×1 IMPLANT
KIT TURNOVER CYSTO (KITS) ×3 IMPLANT
L-HOOK LAP DISP 36CM (ELECTROSURGICAL)
LHOOK LAP DISP 36CM (ELECTROSURGICAL) IMPLANT
LIGASURE VESSEL 5MM BLUNT TIP (ELECTROSURGICAL) ×2 IMPLANT
MANIPULATOR VCARE LG CRV RETR (MISCELLANEOUS) IMPLANT
MANIPULATOR VCARE SML CRV RETR (MISCELLANEOUS) IMPLANT
MANIPULATOR VCARE STD CRV RETR (MISCELLANEOUS) ×2 IMPLANT
NS IRRIG 500ML POUR BTL (IV SOLUTION) ×3 IMPLANT
PACK LAP CHOLECYSTECTOMY (MISCELLANEOUS) ×3 IMPLANT
PAD OB MATERNITY 4.3X12.25 (PERSONAL CARE ITEMS) ×3 IMPLANT
PAD PREP 24X41 OB/GYN DISP (PERSONAL CARE ITEMS) ×3 IMPLANT
PENCIL ELECTRO HAND CTR (MISCELLANEOUS) ×3 IMPLANT
SCRUB CHG 4% DYNA-HEX 4OZ (MISCELLANEOUS) ×1 IMPLANT
SET CYSTO W/LG BORE CLAMP LF (SET/KITS/TRAYS/PACK) IMPLANT
SET YANKAUER POOLE SUCT (MISCELLANEOUS) ×3 IMPLANT
SLEEVE ENDOPATH XCEL 5M (ENDOMECHANICALS) ×6 IMPLANT
SURGILUBE 2OZ TUBE FLIPTOP (MISCELLANEOUS) ×3 IMPLANT
SUT ENDO VLOC 180-0-8IN (SUTURE) IMPLANT
SUT MNCRL 4-0 (SUTURE) ×3
SUT MNCRL 4-0 27XMFL (SUTURE) ×1
SUT VIC AB 0 CT1 36 (SUTURE) ×6 IMPLANT
SUTURE MNCRL 4-0 27XMF (SUTURE) ×1 IMPLANT
TROCAR XCEL NON-BLD 5MMX100MML (ENDOMECHANICALS) ×3 IMPLANT
TUBING EVAC SMOKE HEATED PNEUM (TUBING) ×3 IMPLANT

## 2019-03-11 NOTE — Anesthesia Preprocedure Evaluation (Signed)
Anesthesia Evaluation  Patient identified by MRN, date of birth, ID band Patient awake    Reviewed: Allergy & Precautions, NPO status , Patient's Chart, lab work & pertinent test results  History of Anesthesia Complications Negative for: history of anesthetic complications  Airway Mallampati: III       Dental   Pulmonary asthma , neg sleep apnea, neg COPD,           Cardiovascular hypertension, Pt. on medications and Pt. on home beta blockers (-) Past MI and (-) CHF (-) dysrhythmias (-) Valvular Problems/Murmurs  POTS tx with metoprolol S/P P/E    Neuro/Psych neg Seizures Depression    GI/Hepatic Neg liver ROS, neg GERD  ,  Endo/Other  neg diabetes  Renal/GU negative Renal ROS     Musculoskeletal   Abdominal   Peds  Hematology   Anesthesia Other Findings   Reproductive/Obstetrics                            Anesthesia Physical Anesthesia Plan  ASA: III  Anesthesia Plan: General   Post-op Pain Management:    Induction: Intravenous  PONV Risk Score and Plan: 3 and Ondansetron, Dexamethasone and Midazolam  Airway Management Planned: Oral ETT  Additional Equipment:   Intra-op Plan:   Post-operative Plan:   Informed Consent: I have reviewed the patients History and Physical, chart, labs and discussed the procedure including the risks, benefits and alternatives for the proposed anesthesia with the patient or authorized representative who has indicated his/her understanding and acceptance.       Plan Discussed with:   Anesthesia Plan Comments:         Anesthesia Quick Evaluation

## 2019-03-11 NOTE — H&P (Signed)
Preoperative History and Physical  Debra Terry is a 33 y.o. G3P2011 here for surgical management of abnormal uterine bleeding.  She has failed conservative treatment.   No significant preoperative concerns.  Proposed surgery: Total laparoscopic hysterectomy, bilateral salpingectomy.  Past Medical History:  Diagnosis Date  . Allergic rhinitis   . Asthma    childhood asthma  . Complication of anesthesia    woke during T&A  . Depression    lost child 08/2011  . Generalized headaches    frequent  . H/O gastric ulcer   . H/O: menorrhagia    to start depo for this  . History of kidney stones   . 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 History of syncope    neurocardiogenic per pt  . Hx: UTI (urinary tract infection)   . Lupus anticoagulant positive    was tested- negative  . Migraine headache    [facility]  . Motion sickness    all moving vehicles  . Obesity    lost >100 lbs, now gained back some, previously ran 2 mi in am 1 in pm  . POTS (postural orthostatic tachycardia syndrome)    DIAGNOSED AGE 56. NOT FOLLOWED BY CARDIOLOGY   . Seasonal allergies   . Wears contact lenses    Past Surgical History:  Procedure Laterality Date  . CESAREAN SECTION     2013 and 2018  . CESAREAN SECTION WITH BILATERAL TUBAL LIGATION N/A 03/16/2017   Procedure: CESAREAN SECTION WITH BILATERAL TUBAL LIGATION;  Surgeon: Sukhraj Esquivias, Elenora Fenderhelsea C, MD;  Location: [facility] ORS;  Service: Obstetrics;  Laterality: N/A;  . COLONOSCOPY WITH PROPOFOL N/A 05/16/2015   Procedure: COLONOSCOPY WITH PROPOFOL;  Surgeon: Midge Miniumarren Wohl, MD;  [facility];  Service: Endoscopy;  Laterality: N/A;  . EXPLORATORY LAPAROTOMY  2006   Excessive vaginal bleeding  . EYE SURGERY  1999  . TONSILLECTOMY AND ADENOIDECTOMY  1998  . TUBAL LIGATION     OB History  Gravida Para Term Preterm AB Living  3 2 2  0 1 1   SAB TAB Ectopic Multiple Live Births  0 1 0 1 1    # Outcome Date GA Lbr Len/2nd Weight Sex Delivery Anes PTL Lv  3A Term 03/16/17 5452w2d  3070 g M CS-LTranv Spinal    3B Term 03/16/17 5452w2d  3030 g M CS-LTranv Spinal    2 TAB           1 Term     F CS-LVertical   LIV  Patient denies any other pertinent gynecologic issues.   No current facility-administered medications on file prior to encounter.    Current Outpatient Medications on File Prior to Encounter  Medication Sig Dispense Refill  . ARIPiprazole (ABILIFY) 2 MG tablet Take 2 mg by mouth daily.    Marland Kitchen. buPROPion (WELLBUTRIN XL) 300 MG 24 hr tablet Take 300 mg by mouth daily.    . metoprolol tartrate (LOPRESSOR) 100 MG tablet TAKE 1 TABLET(100 MG) BY MOUTH TWICE DAILY (Patient taking differently: Take 100 mg by mouth 2 (two) times daily. ) 180 tablet 1  . Prenatal Vit-Fe Fumarate-FA (MULTIVITAMIN-PRENATAL) 27-0.8 MG TABS tablet Take 1 tablet by mouth daily at 12 noon.    . sertraline (ZOLOFT) 100 MG tablet Take 2 tablets (200 mg total) by mouth daily. (Patient taking differently: Take 200 mg by  mouth 2 (two) times a day. ) 180 tablet 1  . [DISCONTINUED] Cetirizine HCl (ZYRTEC ALLERGY) 10 MG CAPS Take 1 capsule (10 mg total) by mouth daily. 30 capsule   . [DISCONTINUED] fluticasone (FLONASE) 50 MCG/ACT nasal spray Place 2 sprays into the nose daily. 16 g 3  . [DISCONTINUED] promethazine (PHENERGAN) 25 MG tablet Take 1 tablet (25 mg total) by mouth every 8 (eight) hours as needed. 30 tablet 0   Allergies  Allergen Reactions  . Warfarin Sodium     H/o GI bleed.      Social History:   reports that she has never smoked. She has never used smokeless tobacco. She reports that she does not drink alcohol or use drugs.  Family History  Problem Relation Age of Onset  . Hypertension Mother   . Diabetes Father   . Hypertension Father   . Hyperlipidemia Father   . Colitis Father   . Heart disease Father   . Heart disease Maternal  Grandmother   . Hypertension Maternal Grandmother   . Heart disease Maternal Grandfather   . Lung cancer Maternal Grandfather   . Alcohol abuse Maternal Grandfather   . Arthritis Maternal Grandfather   . Heart disease Paternal Grandmother   . Hypertension Paternal Grandmother   . Breast cancer Paternal Grandmother   . Cancer Paternal Grandmother   . Heart disease Paternal Grandfather   . Coronary artery disease Paternal Grandfather 9  . Alcohol abuse Paternal Grandfather   . Arthritis Paternal Grandfather   . Stroke Neg Hx     Review of Systems: Noncontributory  PHYSICAL EXAM: Blood pressure 114/75, pulse 64, temperature (!) 96.5 F (35.8 C), temperature source Tympanic, resp. rate 18, height 5\' 11"  (1.803 m), weight 132.6 kg, last menstrual period 02/16/2019, SpO2 100 %, not currently breastfeeding. General appearance - alert, well appearing, and in no distress Chest - clear to auscultation, no wheezes, rales or rhonchi, symmetric air entry Heart - normal rate and regular rhythm Abdomen - soft, nontender, nondistended, no masses or organomegaly Pelvic - examination not indicated Extremities - peripheral pulses normal, no pedal edema, no clubbing or cyanosis  Labs: Results for orders placed or performed during the hospital encounter of 03/11/19 (from the past 336 hour(s))  Pregnancy, urine POC   Collection Time: 03/11/19 11:04 AM  Result Value Ref Range   Preg Test, Ur NEGATIVE NEGATIVE  Results for orders placed or performed during the hospital encounter of 03/08/19 (from the past 336 hour(s))  SARS Coronavirus 2 (Performed in [place] hospital lab)   Collection Time: 03/08/19  8:32 AM   Specimen: Nasal Swab  Result Value Ref Range   SARS Coronavirus 2 NEGATIVE NEGATIVE  Basic metabolic panel   Collection Time: 03/08/19  8:32 AM  Result Value Ref Range   Sodium 139 135 - 145 mmol/L   Potassium 3.8 3.5 - 5.1 mmol/L   Chloride 107 98 - 111 mmol/L   CO2 22 22 - 32  mmol/L   Glucose, Bld 121 (H) 70 - 99 mg/dL   BUN 15 6 - 20 mg/dL   Creatinine, Ser 0.65 0.44 - 1.00 mg/dL   Calcium 8.9 8.9 - 10.3 mg/dL   GFR calc non Af Amer >60 >60 mL/min   GFR calc Af Amer >60 >60 mL/min   Anion gap 10 5 - 15  CBC   Collection Time: 03/08/19  8:32 AM  Result Value Ref Range   WBC 6.8 4.0 - 10.5 K/uL   RBC  4.96 3.87 - 5.11 MIL/uL   Hemoglobin 15.0 12.0 - 15.0 g/dL   HCT 16.145.5 09.636.0 - 04.546.0 %   MCV 91.7 80.0 - 100.0 fL   MCH 30.2 26.0 - 34.0 pg   MCHC 33.0 30.0 - 36.0 g/dL   RDW 40.913.6 81.111.5 - 91.415.5 %   Platelets 161 150 - 400 K/uL   nRBC 0.0 0.0 - 0.2 %  Type and screen [facility]   Collection Time: 03/08/19  8:32 AM  Result Value Ref Range   ABO/RH(D) O NEG    Antibody Screen NEG    Sample Expiration 03/22/2019,2359    Extend sample reason      NO TRANSFUSIONS OR PREGNANCY IN THE PAST 3 MONTHS Performed at [facility], [address] [place], [place] [address]     Imaging Studies: No results found.  Assessment: Patient Active Problem List   Diagnosis Date Noted  . Supervision of high risk pregnancy in third trimester 02/27/2017  . Mild preeclampsia, third trimester 02/19/2017  . Indication for care in labor or delivery 02/09/2017  . Pregnancy, twins, antepartum 02/05/2017  . Labor and delivery indication for care or intervention 01/15/2017  . Pregnancy 01/03/2017  . History of migraine headaches 09/11/2016  . Rh negative status during pregnancy in first trimester 09/04/2016  . POTS (postural orthostatic tachycardia syndrome) 08/28/2016  . 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 Diarrhea   . Hematochezia   . Cephalalgia 04/20/2014  . Blurred vision 04/20/2014  . Bleeding from the nose 04/20/2014  . Healthcare maintenance 05/01/2011  . Hand pain 05/01/2011  . Allergic rhinitis   . Back pain 02/07/2011  . Arthralgia of multiple joints 01/23/2011  . Depression    . Migraine headache   . History of pulmonary embolism     Plan: Patient will undergo surgical management with [facility].   The risks of surgery were discussed in detail with the patient including but not limited to: bleeding which may require transfusion or reoperation; infection which may require antibiotics; injury to surrounding organs which may involve bowel, bladder, ureters ; need for additional procedures including laparoscopy or laparotomy; thromboembolic phenomenon, surgical site problems and other postoperative/anesthesia complications. Likelihood of success in alleviating the patient's condition was discussed. Routine postoperative instructions will be reviewed with the patient and her family in detail after surgery.  The patient concurred with the proposed plan, giving informed written consent for the surgery.  Patient has been NPO since last night she will remain NPO for procedure.  Anesthesia and OR aware.  Preoperative prophylactic antibiotics and SCDs ordered on call to the OR.  To OR when ready.  ----- Ranae Plumberhelsea Joscelin Fray, MD, FACOG Attending Obstetrician and Gynecologist [facility], Department of OB/GYN [facility] 03/11/2019 11:49 AM

## 2019-03-11 NOTE — Discharge Instructions (Addendum)
Discharge instructions:  Call office if you have any of the following: fever >101 F, chills, shortness of breath, excessive vaginal bleeding, incision drainage or problems, leg pain or redness, or any other concerns.   Activity: Do not lift > 10 lbs for 8 weeks.  No intercourse or tampons for 8 weeks.  No driving for 1-2 weeks, or until you are certain you can slam on the brakes.    You may feel some pain in your upper right abdomen/rib and right shoulder.  This is from the gas in the abdomen for surgery. This will subside over time, please be patient!  Take 800mg  Ibuprofen and 1000mg  Tylenol around the clock, every 6 hours for at least the first 3-5 days.  After this you can take as needed.  This will help decrease inflammation and promote healing.  The narcotics you'll take just as needed, as they just trick your brain into thinking its not in pain.    Please don't limit yourself in terms of routine activity.  You will be able to do most things, although they may take longer to do or be a little painful.  You can do it!  Don't be a hero, but don't be a wimp either!     AMBULATORY SURGERY  DISCHARGE INSTRUCTIONS   1) The drugs that you were given will stay in your system until tomorrow so for the next 24 hours you should not:  A) Drive an automobile B) Make any legal decisions C) Drink any alcoholic beverage   2) You may resume regular meals tomorrow.  Today it is better to start with liquids and gradually work up to solid foods.  You may eat anything you prefer, but it is better to start with liquids, then soup and crackers, and gradually work up to solid foods.   3) Please notify your doctor immediately if you have any unusual bleeding, trouble breathing, redness and pain at the surgery site, drainage, fever, or pain not relieved by medication.    4) Additional Instructions:Pt will call for follow up on Monday         Please contact your physician with any problems or  Same Day Surgery at [phone], Monday through Friday 6 am to 4 pm, or [place] at [facility] number at [phone].

## 2019-03-11 NOTE — Anesthesia Procedure Notes (Signed)
Procedure Name: Intubation Date/Time: 03/11/2019 2:22 PM Performed by: Justus Memory, CRNA Pre-anesthesia Checklist: Patient identified, Patient being monitored, Timeout performed, Emergency Drugs available and Suction available Patient Re-evaluated:Patient Re-evaluated prior to induction Oxygen Delivery Method: Circle system utilized Preoxygenation: Pre-oxygenation with 100% oxygen Induction Type: IV induction Ventilation: Mask ventilation without difficulty Laryngoscope Size: Mac and 3 Grade View: Grade I Tube type: Oral Tube size: 7.0 mm Number of attempts: 1 Airway Equipment and Method: Stylet Placement Confirmation: ETT inserted through vocal cords under direct vision,  positive ETCO2 and breath sounds checked- equal and bilateral Secured at: 21 cm Tube secured with: Tape Dental Injury: Teeth and Oropharynx as per pre-operative assessment

## 2019-03-11 NOTE — Anesthesia Post-op Follow-up Note (Signed)
Anesthesia QCDR form completed.        

## 2019-03-11 NOTE — Anesthesia Postprocedure Evaluation (Signed)
Anesthesia Post Note  Patient: Debra Terry  Procedure(s) Performed: HYSTERECTOMY TOTAL LAPAROSCOPIC, bilateral salpingectomy (N/A )  Patient location during evaluation: PACU Anesthesia Type: General Level of consciousness: awake and alert Pain management: pain level controlled Vital Signs Assessment: post-procedure vital signs reviewed and stable Respiratory status: spontaneous breathing, nonlabored ventilation, respiratory function stable and patient connected to nasal cannula oxygen Cardiovascular status: blood pressure returned to baseline and stable Postop Assessment: no apparent nausea or vomiting Anesthetic complications: no     Last Vitals:  Vitals:   03/11/19 1700 03/11/19 1715  BP:  (!) 98/54  Pulse: 71 69  Resp: 15 11  Temp:  36.6 C  SpO2: 98% 95%    Last Pain:  Vitals:   03/11/19 1700  TempSrc:   PainSc: 4                  Precious Haws Piscitello

## 2019-03-11 NOTE — Op Note (Signed)
Total Laparoscopic Hysterectomy Operative Note Procedure Date: 03/11/2019  Patient:  Debra Terry  33 y.o. female  PRE-OPERATIVE DIAGNOSIS:  Dysfunctional Uterine Bleeding  POST-OPERATIVE DIAGNOSIS:  * No post-op diagnosis entered *  PROCEDURE:  Procedure(s): HYSTERECTOMY TOTAL LAPAROSCOPIC, bilateral salpingectomy (N/A)  SURGEON:  Surgeon(s) and Role:    * Ward, Honor Loh, MD - Primary Adrienne R. FNRA ANESTHESIA:  General via ET  I/O: 1000cc crystalloid, 50cc EBL, 500cc UOP  FINDINGS: Top nirmal uterus, normal ovaries and fallopian tubes bilaterally.  Normal upper abdomen. Omental adhesions to umbilicus.  SPECIMEN: Uterus, Cervix, and bilateral fallopian tubes  COMPLICATIONS: none apparent  DISPOSITION: vital signs stable to PACU  Indication for Surgery: 33 y.o. [id number] with abnormal uterine bleeding unresponsive to hormonal manipulation who requests definitive treatment.   Risks of surgery were discussed with the patient including but not limited to: bleeding which may require transfusion or reoperation; infection which may require antibiotics; injury to bowel, bladder, ureters or other surrounding organs; need for additional procedures including laparotomy, blood clot, incisional problems and other postoperative/anesthesia complications. Written informed consent was obtained.      PROCEDURE IN DETAIL:  The patient had 40u Lovenox Sub-q and sequential compression devices applied to her lower extremities while in the preoperative area.  She was then taken to the operating room. IV antibiotics were given. General anesthesia was administered via endotracheal route.  She was placed in the dorsal lithotomy position, and was prepped and draped in a sterile manner. A surgical time-out was performed.  A Foley catheter was inserted into her bladder and attached to constant drainage and a V-Care uterine manipulator was then advanced into the uterus and a good fit around the cervix was  noted. The gloves were changed, and attention was turned to the abdomen where an umbilical incision was made with the scalpel.  A 33mm trochar was inserted in the umbilical incision using a visiport method.Opening pressure was 65mmHg, and the abdomen was insufflated to 15mg Hg carbon dioxide gas and adequate pneumoperitoneum was obtained. A survey of the patient's pelvis and abdomen revealed the findings as mentioned above. Two 57mm ports were inserted in the lower left and right quadrants under visualization.    The bilateral fallopian tubes were separated from the mesosalpinx using the Ligasure. The bilateral round ligaments were transected and anterior broad ligament divided and brought across the uterus to separate the vesicouterine peritoneum and create a bladder flap. The bladder was pushed away from the uterus. The bilateral uterine arteries were skeletonized, ligated and transected. The bilateral uterosacral and cardinal ligaments were ligated and transected. A colpotomy was made around the V-Care cervical cup and the uterus, cervix, and bilateral tubes were removed through the vagina. The vaginal cuff was closed vaginally using 0-Vicryl in a running locking stitch. This was tested for integrity using the surgeon's finger. After a change of gloves, the pneumoperitoneum was recreated and surgical site inspected, and found to be hemostatic. Bilateral ureters were visualized vermiuclating. No intraoperative injury to surrounding organs was noted. The abdomen was desufflated and all instruments were then removed.   All skin incisions were closed with 4-0 monocryl and covered with surgical glue. The patient tolerated the procedures well.  All instruments, needles, and sponge counts were correct x 2. The patient was taken to the recovery room in stable condition.   ---- Larey Days, MD Attending Obstetrician and [facility]

## 2019-03-11 NOTE — Transfer of Care (Signed)
Immediate Anesthesia Transfer of Care Note  Patient: [place]  Procedure(s) Performed: HYSTERECTOMY TOTAL LAPAROSCOPIC, bilateral salpingectomy (N/A )  Patient Location: PACU  Anesthesia Type:General  Level of Consciousness: awake, drowsy and patient cooperative  Airway & Oxygen Therapy: Patient Spontanous Breathing and Patient connected to face mask oxygen  Post-op Assessment: Report given to RN and Post -op Vital signs reviewed and stable  Post vital signs: Reviewed and stable  Last Vitals:  Vitals Value Taken Time  BP 104/50 03/11/19 1622  Temp 36.5 C 03/11/19 1620  Pulse 82 03/11/19 1624  Resp 19 03/11/19 1624  SpO2 100 % 03/11/19 1624  Vitals shown include unvalidated device data.  Last Pain:  Vitals:   03/11/19 1101  TempSrc: Tympanic  PainSc: 0-No pain         Complications: No apparent anesthesia complications

## 2019-03-12 ENCOUNTER — Encounter: Payer: Self-pay | Admitting: Obstetrics & Gynecology

## 2019-03-15 NOTE — Addendum Note (Signed)
Addendum  created 03/15/19 0855 by Doreen Salvage, CRNA   Charge Capture section accepted

## 2019-03-16 LAB — SURGICAL PATHOLOGY

## 2019-03-28 ENCOUNTER — Ambulatory Visit
Admission: RE | Admit: 2019-03-28 | Discharge: 2019-03-28 | Disposition: A | Discharge status: Home / Self Care | Payer: Managed Care, Other (non HMO) | Source: Ambulatory Visit | Attending: Obstetrics & Gynecology | Admitting: Obstetrics & Gynecology

## 2019-03-28 ENCOUNTER — Other Ambulatory Visit: Payer: Self-pay | Admitting: Obstetrics & Gynecology

## 2019-03-28 DIAGNOSIS — Z90710 Acquired absence of both cervix and uterus: Secondary | ICD-10-CM | POA: Insufficient documentation

## 2019-03-28 DIAGNOSIS — G8918 Other acute postprocedural pain: Secondary | ICD-10-CM | POA: Insufficient documentation

## 2019-03-28 MED ORDER — IOTHALAMATE MEGLUMINE 17.2 % UR SOLN: 250.0000 mL | Freq: Once | URETHRAL | Status: DC | PRN

## 2019-05-24 ENCOUNTER — Telehealth: Payer: Self-pay | Admitting: Adult Health

## 2019-05-25 ENCOUNTER — Encounter: Payer: Self-pay | Admitting: Adult Health

## 2019-05-25 ENCOUNTER — Ambulatory Visit: Payer: Managed Care, Other (non HMO) | Admitting: Adult Health

## 2019-05-25 ENCOUNTER — Other Ambulatory Visit: Payer: Self-pay

## 2019-05-25 VITALS — Temp 99.4°F

## 2019-05-25 DIAGNOSIS — Z7189 Other specified counseling: Secondary | ICD-10-CM | POA: Diagnosis not present

## 2019-05-25 DIAGNOSIS — R6889 Other general symptoms and signs: Secondary | ICD-10-CM

## 2019-05-25 DIAGNOSIS — N938 Other specified abnormal uterine and vaginal bleeding: Secondary | ICD-10-CM | POA: Insufficient documentation

## 2019-05-25 DIAGNOSIS — J069 Acute upper respiratory infection, unspecified: Secondary | ICD-10-CM | POA: Diagnosis not present

## 2019-05-25 DIAGNOSIS — Z20822 Contact with and (suspected) exposure to covid-19: Secondary | ICD-10-CM

## 2019-05-25 NOTE — Progress Notes (Signed)
Cough is productive,  No Hx of Asthma Denies difficulty breathing,  Tylenol 1000mg  q 6 hrs for fever  Runny eyes, no ear pain.

## 2019-05-25 NOTE — Patient Instructions (Signed)
Cough, Adult A cough helps to clear your throat and lungs. A cough may be a sign of an illness or another medical condition. An acute cough may only last 2-3 weeks, while a chronic cough may last 8 or more weeks. Many things can cause a cough. They include:  Germs (viruses or bacteria) that attack the airway.  Breathing in things that bother (irritate) your lungs.  Allergies.  Asthma.  Mucus that runs down the back of your throat (postnasal drip).  Smoking.  Acid backing up from the stomach into the tube that moves food from the mouth to the stomach (gastroesophageal reflux).  Some medicines.  Lung problems.  Other medical conditions, such as heart failure or a blood clot in the lung (pulmonary embolism). Follow these instructions at home: Medicines  Take over-the-counter and prescription medicines only as told by your doctor.  Talk with your doctor before you take medicines that stop a cough (coughsuppressants). Lifestyle   Do not smoke, and try not to be around smoke. Do not use any products that contain nicotine or tobacco, such as cigarettes, e-cigarettes, and chewing tobacco. If you need help quitting, ask your doctor.  Drink enough fluid to keep your pee (urine) pale yellow.  Avoid caffeine.  Do not drink alcohol if your doctor tells you not to drink. General instructions   Watch for any changes in your cough. Tell your doctor about them.  Always cover your mouth when you cough.  Stay away from things that make you cough, such as perfume, candles, campfire smoke, or cleaning products.  If the air is dry, use a cool mist vaporizer or humidifier in your home.  If your cough is worse at night, try using extra pillows to raise your head up higher while you sleep.  Rest as needed.  Keep all follow-up visits as told by your doctor. This is important. Contact a doctor if:  You have new symptoms.  You cough up pus.  Your cough does not get better after 2-3  weeks, or your cough gets worse.  Cough medicine does not help your cough and you are not sleeping well.  You have pain that gets worse or pain that is not helped with medicine.  You have a fever.  You are losing weight and you do not know why.  You have night sweats. Get help right away if:  You cough up blood.  You have trouble breathing.  Your heartbeat is very fast. These symptoms may be an emergency. Do not wait to see if the symptoms will go away. Get medical help right away. Call your local emergency services (911 in the [place] Do not drive yourself to the hospital. Summary  A cough helps to clear your throat and lungs. Many things can cause a cough.  Take over-the-counter and prescription medicines only as told by your doctor.  Always cover your mouth when you cough.  Contact a doctor if you have new symptoms or you have a cough that does not get better or gets worse. This information is not intended to replace advice given to you by your health care provider. Make sure you discuss any questions you have with your health care provider. Document Released: 05/01/2011 Document Revised: 09/06/2018 Document Reviewed: 09/06/2018 Elsevier Patient Education  [place]. Upper Respiratory Infection, Adult An upper respiratory infection (URI) affects the nose, throat, and upper air passages. URIs are caused by germs (viruses). The most common type of URI is often called "the common  cold." Medicines cannot cure URIs, but you can do things at home to relieve your symptoms. URIs usually get better within 7-10 days. Follow these instructions at home: Activity  Rest as needed.  If you have a fever, stay home from work or school until your fever is gone, or until your doctor says you may return to work or school. ? You should stay home until you cannot spread the infection anymore (you are not contagious). ? Your doctor may have you wear a face mask so you have less risk of  spreading the infection. Relieving symptoms  Gargle with a salt-water mixture 3-4 times a day or as needed. To make a salt-water mixture, completely dissolve -1 tsp of salt in 1 cup of warm water.  Use a cool-mist humidifier to add moisture to the air. This can help you breathe more easily. Eating and drinking   Drink enough fluid to keep your pee (urine) pale yellow.  Eat soups and other clear broths. General instructions   Take over-the-counter and prescription medicines only as told by your doctor. These include cold medicines, fever reducers, and cough suppressants.  Do not use any products that contain nicotine or tobacco. These include cigarettes and e-cigarettes. If you need help quitting, ask your doctor.  Avoid being where people are smoking (avoid secondhand smoke).  Make sure you get regular shots and get the flu shot every year.  Keep all follow-up visits as told by your doctor. This is important. How to avoid spreading infection to others   Wash your hands often with soap and water. If you do not have soap and water, use hand sanitizer.  Avoid touching your mouth, face, eyes, or nose.  Cough or sneeze into a tissue or your sleeve or elbow. Do not cough or sneeze into your hand or into the air. Contact a doctor if:  You are getting worse, not better.  You have any of these: ? A fever. ? Chills. ? Brown or red mucus in your nose. ? Yellow or brown fluid (discharge)coming from your nose. ? Pain in your face, especially when you bend forward. ? Swollen neck glands. ? Pain with swallowing. ? White areas in the back of your throat. Get help right away if:  You have shortness of breath that gets worse.  You have very bad or constant: ? Headache. ? Ear pain. ? Pain in your forehead, behind your eyes, and over your cheekbones (sinus pain). ? Chest pain.  You have long-lasting (chronic) lung disease along with any of these: ? Wheezing. ? Long-lasting  cough. ? Coughing up blood. ? A change in your usual mucus.  You have a stiff neck.  You have changes in your: ? Vision. ? Hearing. ? Thinking. ? Mood. Summary  An upper respiratory infection (URI) is caused by a germ called a virus. The most common type of URI is often called "the common cold."  URIs usually get better within 7-10 days.  Take over-the-counter and prescription medicines only as told by your doctor. This information is not intended to replace advice given to you by your health care provider. Make sure you discuss any questions you have with your health care provider. Document Released: 02/04/2008 Document Revised: 08/26/2018 Document Reviewed: 04/10/2017 Elsevier Patient Education  2020 [employer] Hello Debra Terry,  You are being placed in the home monitoring program for COVID-19 (commonly known as Coronavirus).  This is because you are suspected to have the virus or are known to have  the virus.  If you are unsure which group you fall into call your clinic.    As part of this program, you'll answer a daily questionnaire in the MyChart mobile app. You'll receive a notification through the MyChart app when the questionnaire is available. When you log in to MyChart, you'll see the tasks in your To Do activity.       Clinicians will see any answers that are concerning and take appropriate steps.  If at any point you are having a medical emergency, call 911.  If otherwise concerned call your clinic instead of coming into the clinic or hospital.  To keep from spreading the disease you should: Stay home and limit contact with other people as much as possible.  Wash your hands frequently. Cover your coughs and sneezes with a tissue, and throw used tissues in the trash.   Clean and disinfect frequently touched surfaces and objects.    Take care of yourself by: Staying home Resting Drinking fluids Take fever-reducing medications (Tylenol/Acetaminophen and Ibuprofen)   For more information on the disease go to the Centers for Disease Control and Prevention website   COVID-19: How to Protect Yourself and Others Know how it spreads  There is currently no vaccine to prevent coronavirus disease 2019 (COVID-19).  The best way to prevent illness is to avoid being exposed to this virus.  The virus is thought to spread mainly from person-to-person. ? Between people who are in close contact with one another (within about 6 feet). ? Through respiratory droplets produced when an infected person coughs, sneezes or talks. ? These droplets can land in the mouths or noses of people who are nearby or possibly be inhaled into the lungs. ? Some recent studies have suggested that COVID-19 may be spread by people who are not showing symptoms. Everyone should Clean your hands often  Wash your hands often with soap and water for at least 20 seconds especially after you have been in a public place, or after blowing your nose, coughing, or sneezing.  If soap and water are not readily available, use a hand sanitizer that contains at least 60% alcohol. Cover all surfaces of your hands and rub them together until they feel dry.  Avoid touching your eyes, nose, and mouth with unwashed hands. Avoid close contact  Stay home if you are sick.  Avoid close contact with people who are sick.  Put distance between yourself and other people. ? Remember that some people without symptoms may be able to spread virus. ? This is especially important for people who are at higher risk of getting very [employer].it Cover your mouth and nose with a cloth face cover when around others  You could spread COVID-19 to others even if you do not feel sick.  Everyone should wear a cloth face cover when they have to go out in public, for example to the grocery store or to pick up other necessities. ? Cloth face coverings  should not be placed on young children under age 49, anyone who has trouble breathing, or is unconscious, incapacitated or otherwise unable to remove the mask without assistance.  The cloth face cover is meant to protect other people in case you are infected.  Do NOT use a facemask meant for a Research scientist (physical sciences).  Continue to keep about 6 feet between yourself and others. The cloth face cover is not a substitute for social distancing. Cover coughs and sneezes  If you are in a private setting and  do not have on your cloth face covering, remember to always cover your mouth and nose with a tissue when you cough or sneeze or use the inside of your elbow.  Throw used tissues in the trash.  Immediately wash your hands with soap and water for at least 20 seconds. If soap and water are not readily available, clean your hands with a hand sanitizer that contains at least 60% alcohol. Clean and disinfect  Clean AND disinfect frequently touched surfaces daily. This includes tables, doorknobs, light switches, countertops, handles, desks, phones, keyboards, toilets, faucets, and sinks. [employer]  If surfaces are dirty, clean them: Use detergent or soap and water prior to disinfection.  Then, use a household disinfectant. You can see a list of EPA-registered household disinfectants here. [employer] 01/04/2019 This information is not intended to replace advice given to you by your health care provider. Make sure you discuss any questions you have with your health care provider. Document Released: 12/14/2018 Document Revised: 01/12/2019 Document Reviewed: 12/14/2018 Elsevier Patient Education  2020 [employer].

## 2019-05-25 NOTE — Progress Notes (Addendum)
  Virtual Visit via Video Note  I connected with [place] on 05/25/19 at 11:00 AM EDT by a video enabled telemedicine application and verified that I am speaking with the correct person using two identifiers.  Location: Patient: at home  Provider: [facility]   I discussed the limitations of evaluation and management by telemedicine and the availability of in person appointments. The patient expressed understanding and agreed to proceed.   History of Present Illness: Patient is a 33 year old female is no acute distress who calls the clinic for a virtual visit.   She has a cough x 2 days, sinus drainage. Low grade fever.  99 oral temperature.  Cough mildly productive, she has not seen any color. Sore throat with drainage. Nasal drainage clear. No abdominal pain or diarrhea/ vomiting.  She took temperature above and then took 1000 mg tylenol after that she has used tylenol twice.  Denies any ear pain.  She takes zyrtec daily.  Denies any pain with breathing. She works at [employer]. She denies any exposure to Covid, no ill contacts.  Denies any wheezing. Denies any shortness of breath.  Denies any lightheadedness or dizziness.  Symptom onset 2 days ago. Temperature elevated today 05/25/19 only.   Patient  denies any body aches,chills, rash, chest pain, shortness of breath, nausea, vomiting, or diarrhea.     Observations/Objective:Temperature 99.4 oral per patient reported and also reports this reading was prior to tylenol this morning. No other vital signs are available.   Patient is alert and oriented and responsive to questions Engages in conversation with provider. Speaks in full sentences without any pauses without any shortness of breath or distress.  Speaks 15 plus words without pause or any distress heard.  No audible stridor or wheezing.   Assessment and Plan:  Suspected Covid-19 Virus Infection  Educated About Covid-19 Virus  Infection  Acute upper respiratory infection  Advice Given About 2019 Novel Coronavirus by Telephone  Orders Placed This Encounter  Procedures  . Novel Coronavirus, NAA ([facility])    Follow Up Instructions: Discussed using Tylenol PRN only. Not around the clock and to let medication wear off and check temperature then.  Discussed RED Flags and when to seek emergency medical attention.   Covid testing. Quarantine work note sent to patient in my chart.   Advised patient call the office or your primary care doctor for an appointment if no improvement within 72 hours or if any symptoms change or worsen at any time  Advised ER or urgent Care if after hours or on weekend. Call 911 for emergency symptoms at any time.Patinet verbalized understanding of all instructions given/reviewed and treatment plan and has no further questions or concerns at this time.    I discussed the assessment and treatment plan with the patient. The patient was provided an opportunity to ask questions and all were answered. The patient agreed with the plan and demonstrated an understanding of the instructions.   The patient was advised to call back or seek an in-person evaluation if the symptoms worsen or if the condition fails to improve as anticipated.  I provided 15 minutes of non-face-to-face time during this encounter.   Marcille Buffy, FNP

## 2019-05-26 ENCOUNTER — Encounter: Payer: Self-pay | Admitting: Adult Health

## 2019-05-26 ENCOUNTER — Other Ambulatory Visit: Payer: Self-pay

## 2019-05-26 DIAGNOSIS — Z20822 Contact with and (suspected) exposure to covid-19: Secondary | ICD-10-CM

## 2019-05-27 LAB — NOVEL CORONAVIRUS, NAA: SARS-CoV-2, NAA: NOT DETECTED

## 2019-05-30 ENCOUNTER — Telehealth: Payer: Self-pay

## 2019-05-30 NOTE — Telephone Encounter (Signed)
Patient called and stated that since she was seen for a virtual visit 4 days ago she has developed a productive cough, is loosing her voice and has more congestion with mild audible Shortness of breath heard by provider over the phone. She was advised due to new/worsening symptoms to be seen in person at [facility] now. She gave verbal understanding.

## 2020-02-27 ENCOUNTER — Other Ambulatory Visit: Payer: Self-pay

## 2020-02-27 DIAGNOSIS — N6323 Unspecified lump in the left breast, lower outer quadrant: Secondary | ICD-10-CM

## 2020-03-12 ENCOUNTER — Ambulatory Visit
Admission: RE | Admit: 2020-03-12 | Discharge: 2020-03-12 | Disposition: A | Discharge status: Home / Self Care | Payer: Managed Care, Other (non HMO) | Source: Ambulatory Visit

## 2020-03-12 DIAGNOSIS — N6323 Unspecified lump in the left breast, lower outer quadrant: Secondary | ICD-10-CM

## 2020-07-17 ENCOUNTER — Emergency Department: Payer: Managed Care, Other (non HMO)

## 2020-07-17 ENCOUNTER — Emergency Department
Admission: EM | Admit: 2020-07-17 | Discharge: 2020-07-18 | Disposition: A | Discharge status: Home / Self Care | Payer: Managed Care, Other (non HMO) | Attending: Emergency Medicine | Admitting: Emergency Medicine

## 2020-07-17 ENCOUNTER — Encounter: Payer: Self-pay | Admitting: *Deleted

## 2020-07-17 ENCOUNTER — Other Ambulatory Visit: Payer: Self-pay

## 2020-07-17 DIAGNOSIS — N83201 Unspecified ovarian cyst, right side: Secondary | ICD-10-CM | POA: Diagnosis not present

## 2020-07-17 DIAGNOSIS — R0602 Shortness of breath: Secondary | ICD-10-CM | POA: Insufficient documentation

## 2020-07-17 DIAGNOSIS — R079 Chest pain, unspecified: Secondary | ICD-10-CM | POA: Insufficient documentation

## 2020-07-17 DIAGNOSIS — R102 Pelvic and perineal pain: Secondary | ICD-10-CM

## 2020-07-17 DIAGNOSIS — J45909 Unspecified asthma, uncomplicated: Secondary | ICD-10-CM | POA: Insufficient documentation

## 2020-07-17 DIAGNOSIS — R1032 Left lower quadrant pain: Secondary | ICD-10-CM | POA: Insufficient documentation

## 2020-07-17 LAB — BASIC METABOLIC PANEL
Anion gap: 14 (ref 5–15)
BUN: 18 mg/dL (ref 6–20)
CO2: 24 mmol/L (ref 22–32)
Calcium: 9.5 mg/dL (ref 8.9–10.3)
Chloride: 101 mmol/L (ref 98–111)
Creatinine, Ser: 0.6 mg/dL (ref 0.44–1.00)
GFR, Estimated: >60 mL/min (ref >60)
Glucose, Bld: 98 mg/dL (ref 70–99)
Potassium: 4 mmol/L (ref 3.5–5.1)
Sodium: 139 mmol/L (ref 135–145)

## 2020-07-17 LAB — CBC
HCT: 46.7 % — ABNORMAL HIGH (ref 36.0–46.0)
Hemoglobin: 16.4 g/dL — ABNORMAL HIGH (ref 12.0–15.0)
MCH: 31.4 pg (ref 26.0–34.0)
MCHC: 35.1 g/dL (ref 30.0–36.0)
MCV: 89.5 fL (ref 80.0–100.0)
Platelets: 210 10*3/uL (ref 150–400)
RBC: 5.22 MIL/uL — ABNORMAL HIGH (ref 3.87–5.11)
RDW: 11.9 % (ref 11.5–15.5)
WBC: 10.2 10*3/uL (ref 4.0–10.5)
nRBC: 0 % (ref 0.0–0.2)

## 2020-07-17 LAB — LIPASE, BLOOD: Lipase: 192 U/L — ABNORMAL HIGH (ref 11–51)

## 2020-07-17 LAB — TROPONIN I (HIGH SENSITIVITY): Troponin I (High Sensitivity): 3 ng/L (ref <18)

## 2020-07-17 NOTE — ED Triage Notes (Addendum)
Pt to ED reporting SOB, chest tightness and dizziness. Symptoms came on suddenly this afternoon and worsened to where pt was having trouble speaking due to SOB, became diaphoretic and describes what sounds like a near syncopal episode with lightheadedness.  No cough or fevers. Pt has a stress test and echo scheduled for the 12/9 due to chest pain worse with exertion and a family hx of cardiac problems.

## 2020-07-17 NOTE — ED Provider Notes (Signed)
[facility] Emergency Department Provider Note  ____________________________________________   I have reviewed the triage vital signs and the nursing notes.   HISTORY  Chief Complaint Shortness of Breath and Chest Pain   History limited by: Not Limited   HPI Debra Terry is a 34 y.o. female who presents to the emergency department today because of concerns for an episode of chest pain and shortness of breath.  Patient states that the pain was located in the center part of her chest.  It came on suddenly.  It was accompanied by shortness of breath.  At the time my exam the pain has improved and her shortness of breath has improved as well.  She states 2 days ago she started having the left lower quadrant abdominal pain.  The patient states she has had somewhat similar pain in her abdomen before related to ovarian cyst.  Patient denies any fevers.   Records reviewed. Per medical record review patient has a history of POTS, history of pulmonary embolism.   Past Medical History:  Diagnosis Date  . Allergic rhinitis   . Asthma    childhood asthma  . Complication of anesthesia    woke during T&A  . Depression    lost child 08/2011  . Generalized headaches    frequent  . H/O gastric ulcer   . H/O: menorrhagia    to start depo for this  . History of kidney stones   . 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 History of syncope    neurocardiogenic per pt  . Hx: UTI (urinary tract infection)   . Lupus anticoagulant positive    was tested- negative  . Migraine headache    h/o admission at [facility] for this  . Motion sickness    all moving vehicles  . Obesity    lost >100 lbs, now gained back some, previously ran 2 mi in am 1 in pm  . POTS (postural orthostatic tachycardia syndrome)    DIAGNOSED AGE 62. NOT FOLLOWED BY CARDIOLOGY   . Seasonal allergies   . Wears contact  lenses     Patient Active Problem List   Diagnosis Date Noted  . DUB (dysfunctional uterine bleeding) 05/25/2019  . Morbid obesity with BMI of 40.0-44.9, adult (HCC) 01/09/2019  . Supervision of high risk pregnancy in third trimester 02/27/2017  . Mild preeclampsia, third trimester 02/19/2017  . Indication for care in labor or delivery 02/09/2017  . Pregnancy, twins, antepartum 02/05/2017  . Labor and delivery indication for care or intervention 01/15/2017  . Pregnancy 01/03/2017  . History of migraine headaches 09/11/2016  . Rh negative status during pregnancy in first trimester 09/04/2016  . POTS (postural orthostatic tachycardia syndrome) 08/28/2016  . 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 Diarrhea   . Hematochezia   . Cephalalgia 04/20/2014  . Blurred vision 04/20/2014  . Bleeding from the nose 04/20/2014  . Healthcare maintenance 05/01/2011  . Hand pain 05/01/2011  . Allergic rhinitis   . Back pain 02/07/2011  . Arthralgia of multiple joints 01/23/2011  . Depression   . Migraine headache   . History of pulmonary embolism     Past Surgical History:  Procedure Laterality Date  . CESAREAN SECTION     2013 and 2018  . CESAREAN SECTION WITH BILATERAL TUBAL LIGATION N/A 03/16/2017  Procedure: CESAREAN SECTION WITH BILATERAL TUBAL LIGATION;  Surgeon: Ward, Elenora Fender, MD;  Location: [facility] ORS;  Service: Obstetrics;  Laterality: N/A;  . COLONOSCOPY WITH PROPOFOL N/A 05/16/2015   Procedure: COLONOSCOPY WITH PROPOFOL;  Surgeon: Midge Minium, MD;  Location: [facility];  Service: Endoscopy;  Laterality: N/A;  . EXPLORATORY LAPAROTOMY  2006   Excessive vaginal bleeding  . EYE SURGERY  1999  . LAPAROSCOPIC HYSTERECTOMY N/A 03/11/2019   Procedure: HYSTERECTOMY TOTAL LAPAROSCOPIC, bilateral salpingectomy;  Surgeon: Ward, Elenora Fender, MD;  Location: [facility] OR[facility];  Service: Gynecology;  Laterality: N/A;  . TONSILLECTOMY AND  ADENOIDECTOMY  1998  . TUBAL LIGATION      Prior to Admission medications   Medication Sig Start Date End Date Taking? Authorizing Provider  ibuprofen (ADVIL) 800 MG tablet Take 1 tablet (800 mg total) by mouth every 6 (six) hours. 03/11/19   Ward, Elenora Fender, MD  metoprolol tartrate (LOPRESSOR) 100 MG tablet TAKE 1 TABLET(100 MG) BY MOUTH TWICE DAILY Patient taking differently: Take 100 mg by mouth 2 (two) times daily.  11/09/18   Duanne Limerick, MD  Prenatal Vit-Fe Fumarate-FA (MULTIVITAMIN-PRENATAL) 27-0.8 MG TABS tablet Take 1 tablet by mouth daily at 12 noon.    [provider]  sertraline (ZOLOFT) 100 MG tablet Take 2 tablets (200 mg total) by mouth daily. Patient taking differently: Take 200 mg by mouth 2 (two) times a day.  11/09/18   Duanne Limerick, MD  Cetirizine HCl (ZYRTEC ALLERGY) 10 MG CAPS Take 1 capsule (10 mg total) by mouth daily. 05/01/11 02/22/15  Eustaquio Boyden, MD  fluticasone [facility]) 50 MCG/ACT nasal spray Place 2 sprays into the nose daily. 05/01/11 02/22/15  Eustaquio Boyden, MD  promethazine (PHENERGAN) 25 MG tablet Take 1 tablet (25 mg total) by mouth every 8 (eight) hours as needed. 02/06/11 02/22/15  Joaquim Nam, MD    Allergies Warfarin sodium  Family History  Problem Relation Age of Onset  . Hypertension Mother   . Diabetes Father   . Hypertension Father   . Hyperlipidemia Father   . Colitis Father   . Heart disease Father   . Heart disease Maternal Grandmother   . Hypertension Maternal Grandmother   . Heart disease Maternal Grandfather   . Lung cancer Maternal Grandfather   . Alcohol abuse Maternal Grandfather   . Arthritis Maternal Grandfather   . Heart disease Paternal Grandmother   . Hypertension Paternal Grandmother   . Breast cancer Paternal Grandmother   . Cancer Paternal Grandmother   . Heart disease Paternal Grandfather   . Coronary artery disease Paternal Grandfather 59  . Alcohol abuse Paternal Grandfather   . Arthritis  Paternal Grandfather   . Stroke Neg Hx     Social History Social History   Tobacco Use  . Smoking status: Never Smoker  . Smokeless tobacco: Never Used  Vaping Use  . Vaping Use: Never used  Substance Use Topics  . Alcohol use: No    Comment: Rare/not during pregnancy  . Drug use: No    Review of Systems Constitutional: No fever/chills Eyes: No visual changes. ENT: No sore throat. Cardiovascular: Positive for chest pain. Respiratory: Positive for shortness of breath. Gastrointestinal: Positive for abdominal pain.  No vomiting.  No diarrhea.   Genitourinary: Negative for dysuria. Musculoskeletal: Negative for back pain. Skin: Negative for rash. Neurological: Negative for headaches, focal weakness or numbness.  ____________________________________________   PHYSICAL EXAM:  VITAL SIGNS: ED Triage Vitals  Enc Vitals Group  BP 07/17/20 2113 (!) 144/92     Pulse Rate 07/17/20 2113 72     Resp 07/17/20 2113 16     Temp 07/17/20 2113 98 F (36.7 C)     Temp Source 07/17/20 2113 Oral     SpO2 07/17/20 2113 99 %     Weight 07/17/20 2114 292 lb 5.3 oz (132.6 kg)     Height 07/17/20 2114 5\' 11"  (1.803 m)     Head Circumference --      Peak Flow --      Pain Score 07/17/20 2113 8   Constitutional: Alert and oriented.  Eyes: Conjunctivae are normal.  ENT      Head: Normocephalic and atraumatic.      Nose: No congestion/rhinnorhea.      Mouth/Throat: Mucous membranes are moist.      Neck: No stridor. Hematological/Lymphatic/Immunilogical: No cervical lymphadenopathy. Cardiovascular: Normal rate, regular rhythm.  No murmurs, rubs, or gallops.  Respiratory: Normal respiratory effort without tachypnea nor retractions. Breath sounds are clear and equal bilaterally. No wheezes/rales/rhonchi. Gastrointestinal: Soft and tender to palpation in the left lower quadrant. No epigastric tenderness.  Genitourinary: Deferred Musculoskeletal: Normal range of motion in all  extremities. No lower extremity edema. Neurologic:  Normal speech and language. No gross focal neurologic deficits are appreciated.  Skin:  Skin is warm, dry and intact. No rash noted. Psychiatric: Mood and affect are normal. Speech and behavior are normal. Patient exhibits appropriate insight and judgment.  ____________________________________________    LABS (pertinent positives/negatives)  CBC wbc 10.2, hgb 16.4, plt 210 Lipase 192 Trop hs 3 BMP wnl  BNP 17.1 ____________________________________________   EKG  I, 2114, attending physician, personally viewed and interpreted this EKG  EKG Time: 2116 Rate: 76 Rhythm: normal sinus rhythm Axis: normal Intervals: qtc 456 QRS: narrow ST changes: no st elevation Impression: normal ekg  ____________________________________________    RADIOLOGY  CXR Mild interstitial prominence could be edema vs atypical infection  ____________________________________________   PROCEDURES  Procedures  ____________________________________________   INITIAL IMPRESSION / ASSESSMENT AND PLAN / ED COURSE  Pertinent labs & imaging results that were available during my care of the patient were reviewed by me and considered in my medical decision making (see chart for details).   Patient presented to the emergency department today with primary complaint of chest pain. Troponin negative, d-dimer negative, cxr without concerning finding. Doubt dissection given clinical history. At this point somewhat unclear etiology of the chest pain, would consider esophageal etiology as a possibility. Additionally patient had complaint of left lower quadrant pain reminiscent of previous ovarian cyst. 2117 was performed which showed a right ovarian cyst, however could not visualize left ovary. CT scan was then performed without any clear etiology of the pain. Discussed these findings with the patient. At this time she did feel comfortable going home.  Encouraged patient to follow up with primary care.  ____________________________________________   FINAL CLINICAL IMPRESSION(S) / ED DIAGNOSES  Final diagnoses:  Left adnexal tenderness  Chest pain, unspecified type     Note: This dictation was prepared with Dragon dictation. Any transcriptional errors that result from this process are unintentional     [place], MD 07/18/20 [phone]

## 2020-07-18 ENCOUNTER — Emergency Department: Payer: Managed Care, Other (non HMO)

## 2020-07-18 LAB — URINALYSIS, COMPLETE (UACMP) WITH MICROSCOPIC
Bilirubin Urine: NEGATIVE
Glucose, UA: NEGATIVE mg/dL
Hgb urine dipstick: NEGATIVE
Ketones, ur: NEGATIVE mg/dL
Leukocytes,Ua: NEGATIVE
Nitrite: NEGATIVE
Protein, ur: NEGATIVE mg/dL
Specific Gravity, Urine: 1.016 (ref 1.005–1.030)
pH: 6 (ref 5.0–8.0)

## 2020-07-18 LAB — TROPONIN I (HIGH SENSITIVITY): Troponin I (High Sensitivity): <2 ng/L (ref <18)

## 2020-07-18 LAB — POC URINE PREG, ED: Preg Test, Ur: NEGATIVE

## 2020-07-18 LAB — FIBRIN DERIVATIVES D-DIMER (ARMC ONLY): Fibrin derivatives D-dimer (ARMC): 261.69 ng/mL (FEU) (ref 0.00–499.00)

## 2020-07-18 LAB — BRAIN NATRIURETIC PEPTIDE: B Natriuretic Peptide: 17.1 pg/mL (ref 0.0–100.0)

## 2020-07-18 MED ORDER — IOHEXOL 350 MG/ML SOLN
100.0000 mL | Freq: Once | INTRAVENOUS | Status: AC | PRN
  Administered 2020-07-18: 100 mL via INTRAVENOUS

## 2020-07-18 MED ORDER — ALUM & MAG HYDROXIDE-SIMETH 200-200-20 MG/5ML PO SUSP
30.0000 mL | Freq: Once | ORAL | Status: AC
  Administered 2020-07-18: 30 mL via ORAL
  Filled 2020-07-18: qty 30

## 2020-07-18 MED ORDER — LIDOCAINE VISCOUS HCL 2 % MT SOLN
15.0000 mL | Freq: Once | OROMUCOSAL | Status: AC
  Administered 2020-07-18: 15 mL via ORAL
  Filled 2020-07-18: qty 15

## 2020-07-18 NOTE — Discharge Instructions (Addendum)
Please seek medical attention for any high fevers, chest pain, shortness of breath, change in behavior, persistent vomiting, bloody stool or any other new or concerning symptoms.  

## 2021-09-29 IMAGING — US US PELVIS COMPLETE WITH TRANSVAGINAL
1 series · 13 of 25 positions shown · non-contrast
Comparison: Prior ultrasound from 10/01/2011

CLINICAL DATA: Initial evaluation for acute left adnexal
tenderness. History of prior hysterectomy and bilateral
salpingectomy.

EXAM:
TRANSABDOMINAL AND TRANSVAGINAL ULTRASOUND OF PELVIS
TECHNIQUE: Both transabdominal and transvaginal ultrasound examinations of the
pelvis were performed. Transabdominal technique was performed for
global imaging of the pelvis including uterus, ovaries, adnexal
regions, and pelvic cul-de-sac. It was necessary to proceed with
endovaginal exam following the transabdominal exam to visualize the
pelvic structures.

[Series 1: us pelvic complete with transvaginal · 13 of 52 slices shown]
[im 1/52]
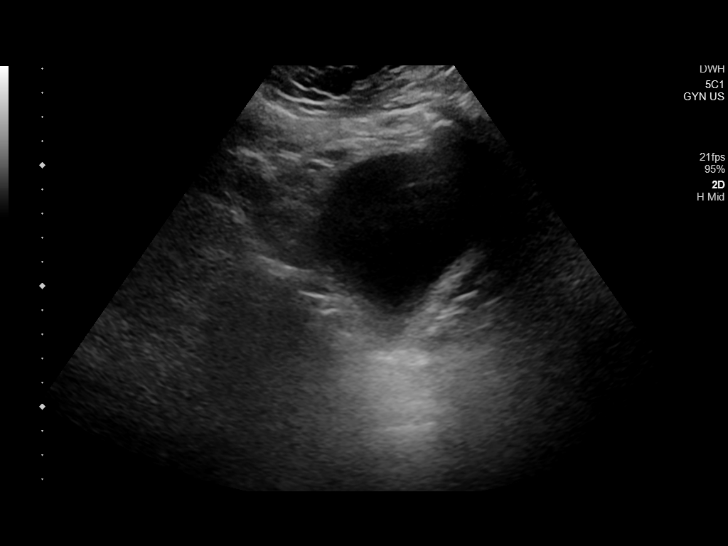
[im 5/52]
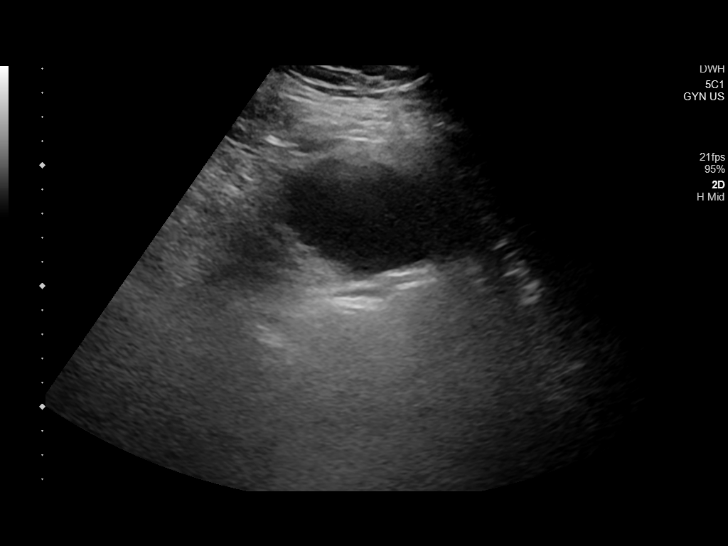
[im 9/52]
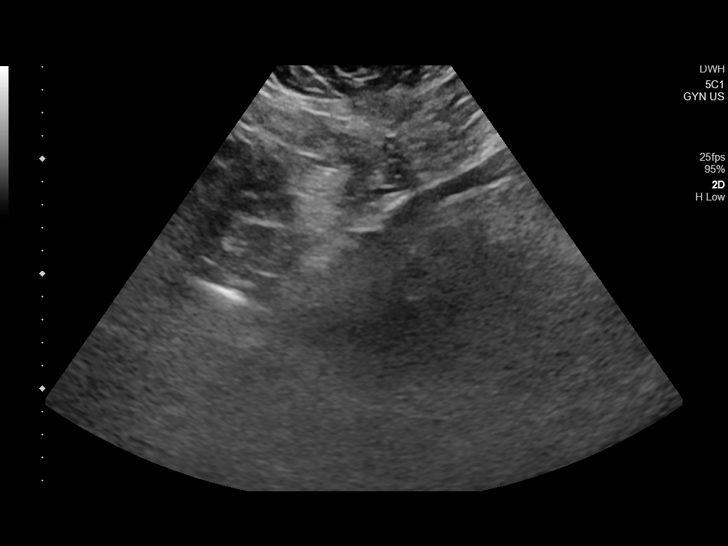
[im 13/52]
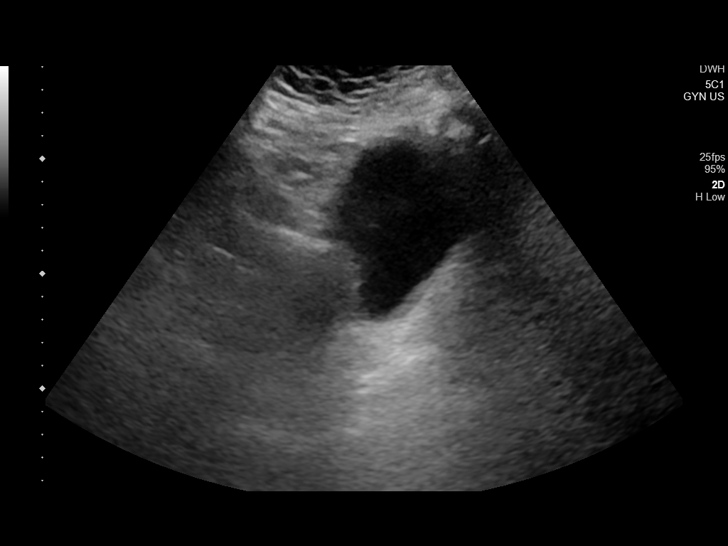
[im 18/52]
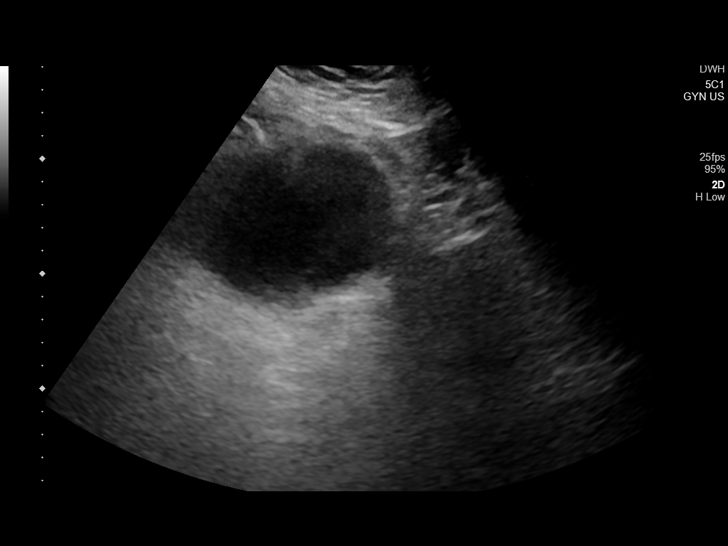
[im 22/52]
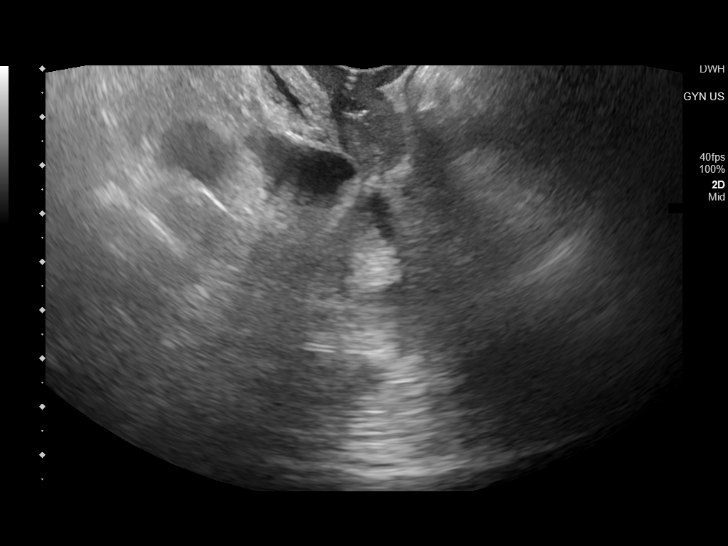
[im 26/52]
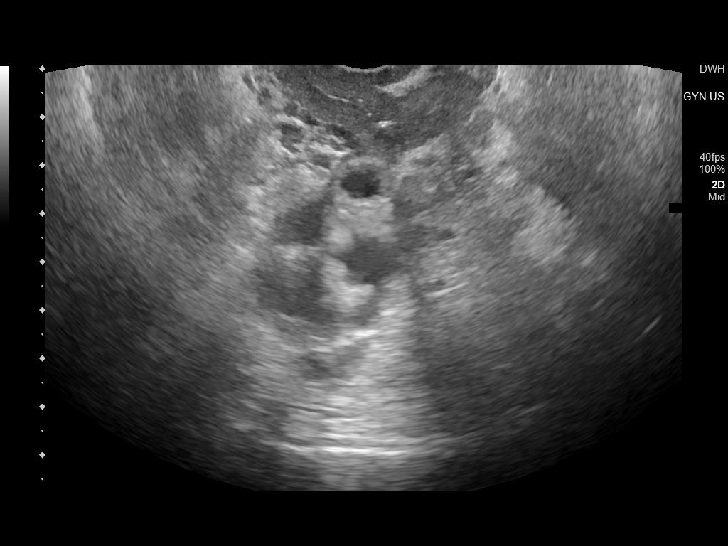
[im 30/52]
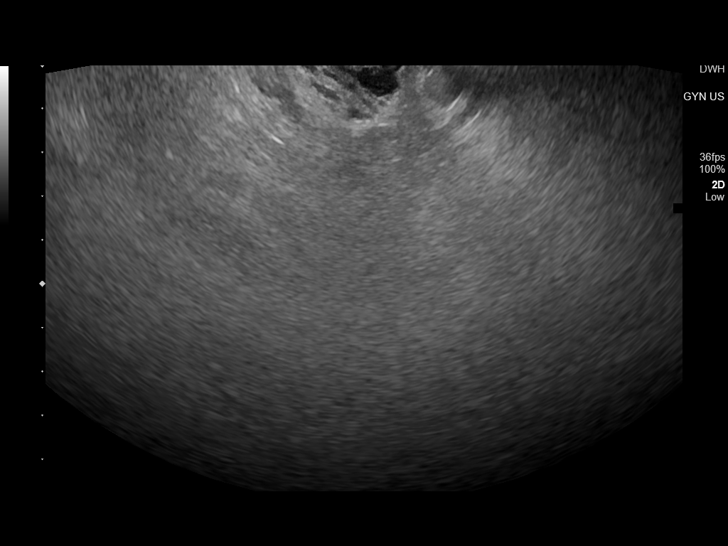
[im 35/52]
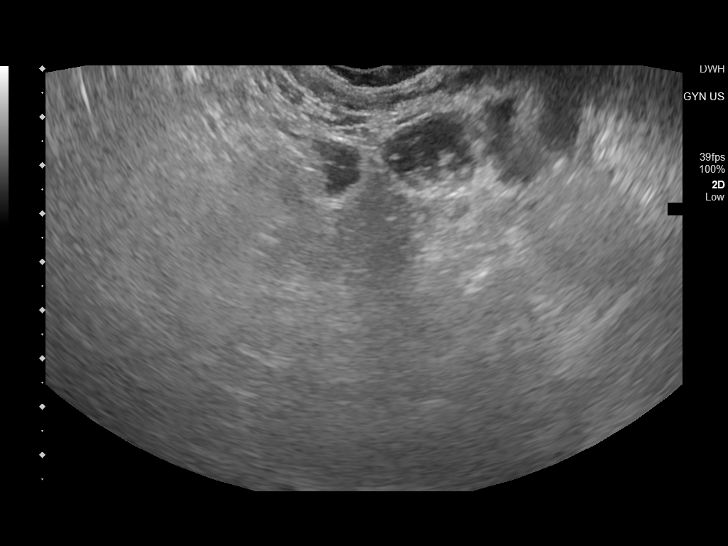
[im 39/52]
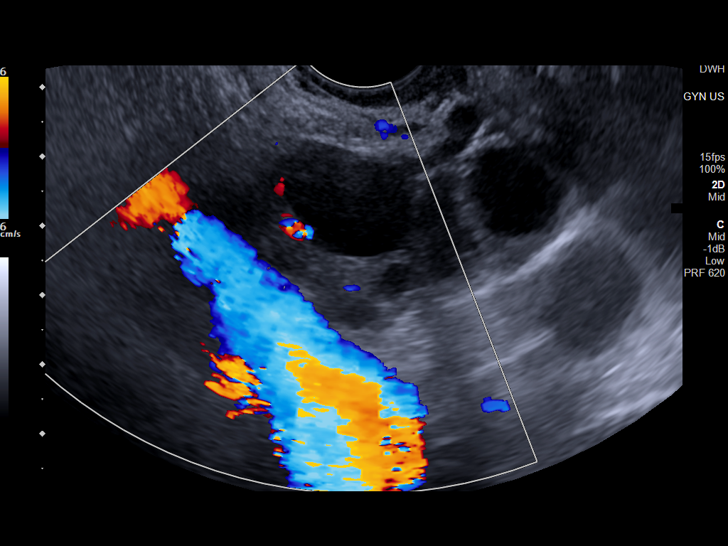
[im 43/52]
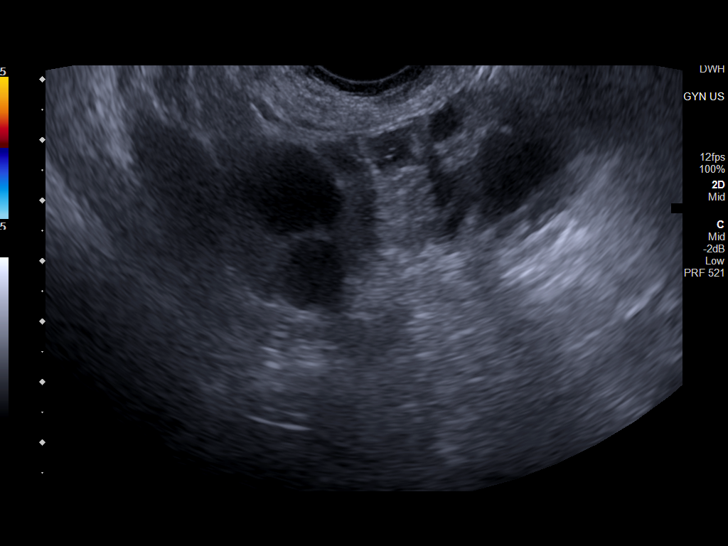
[im 47/52]
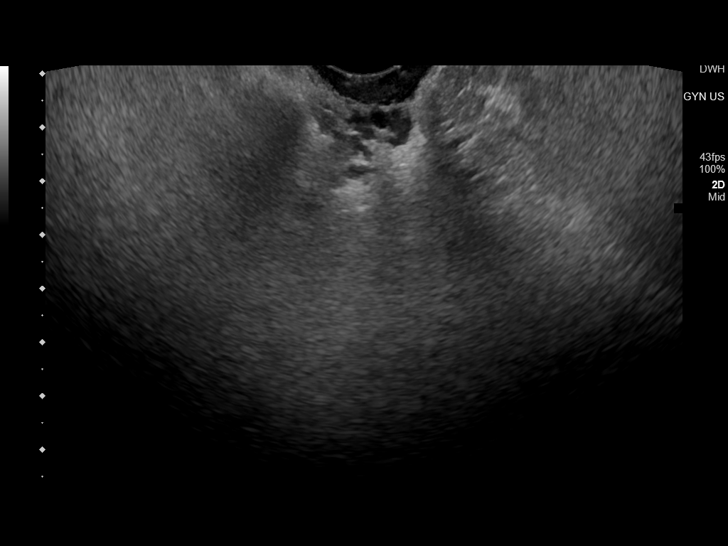
[im 52/52]
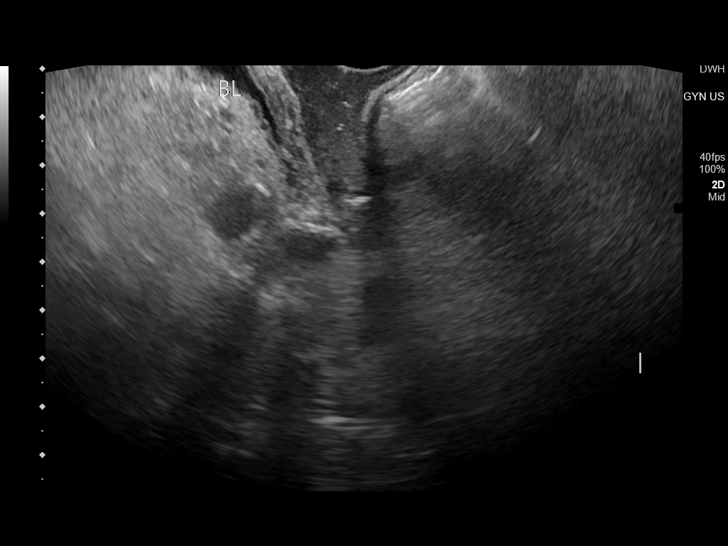

[13 of 25 positions shown; findings below may reference images not displayed]

FINDINGS: Uterus

Prior hysterectomy.  No abnormality about the vaginal cuff.

Endometrium

Surgically absent.

Right ovary

Measurements: 3.2 x 2.3 x 1.6 cm = volume: 6.4 mL. 2.5 x 1.6 x
cm simple cyst, most likely a normal physiologic follicular
cyst/dominant follicle. No other adnexal mass.

Left ovary

Not visualized.  No adnexal mass.

Other findings

No abnormal free fluid.
IMPRESSION: 1. 2.5 cm simple right ovarian cyst, most likely a normal
physiologic follicular cyst/dominant follicle. No followup imaging
recommended. Note: This recommendation does not apply to
premenarchal patients or to those with increased risk (genetic,
family history, elevated tumor markers or other high-risk factors)
of ovarian cancer. Reference: Radiology [DATE]):[id number].
2. No other acute abnormality within the pelvis.
3. Nonvisualization of the left ovary. No other adnexal mass or free
fluid.
4. Prior hysterectomy.

## 2021-09-29 IMAGING — CT CT ABD-PELV W/ CM
2 of 4 series · 16 of 46 positions shown, 18 images · IV contrast (APPLIED)
Comparison: None.

CLINICAL DATA: Right lower quadrant abdominal pain

EXAM:
CT ABDOMEN AND PELVIS WITH CONTRAST
TECHNIQUE: Multidetector CT imaging of the abdomen and pelvis was performed
using the standard protocol following bolus administration of
intravenous contrast.
CONTRAST:  100mL OMNIPAQUE IOHEXOL 350 MG/ML SOLN

[Series 2: routine abd/pel with · axial · 0.88mm/px · z∈[-998,-493]mm · 13 of 111 slices shown, 15 images]
[im 5/111  soft-tissue]
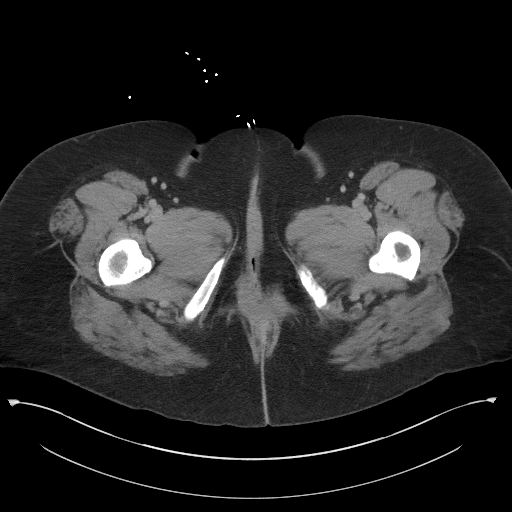
[im 5/111  bone]
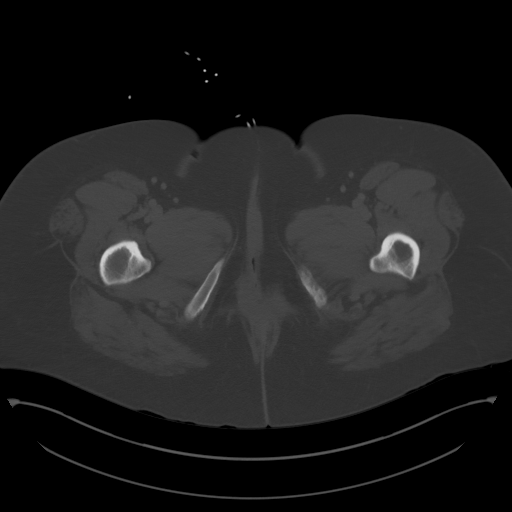
[im 14/111  soft-tissue]
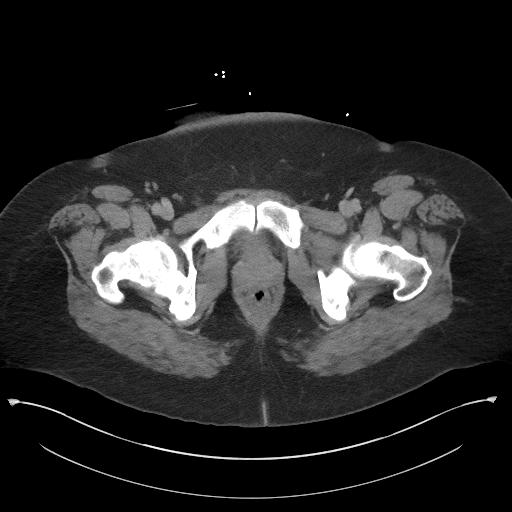
[im 23/111  soft-tissue]
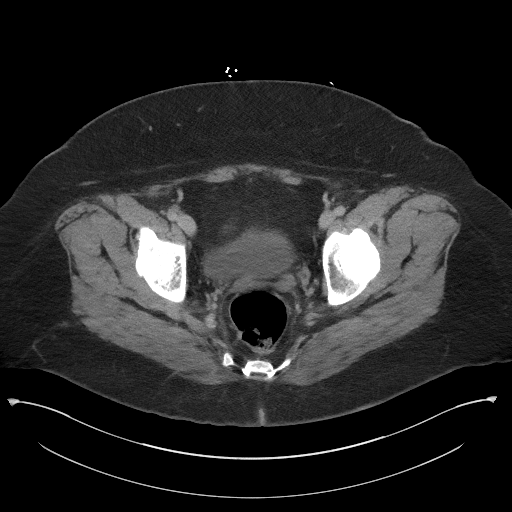
[im 31/111  soft-tissue]
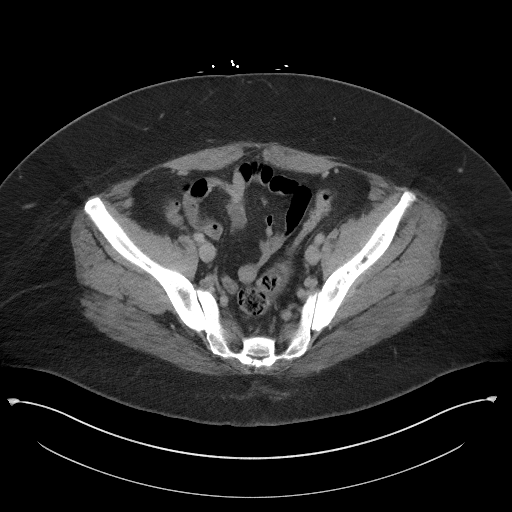
[im 40/111  soft-tissue]
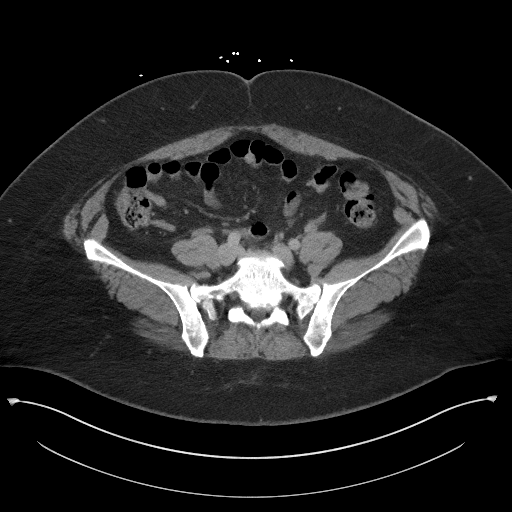
[im 49/111  soft-tissue]
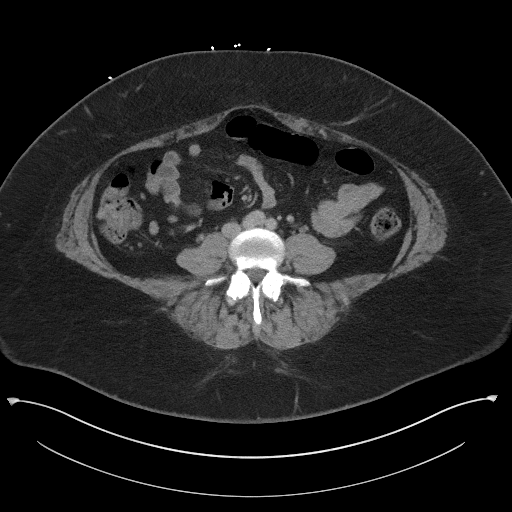
[im 58/111  soft-tissue]
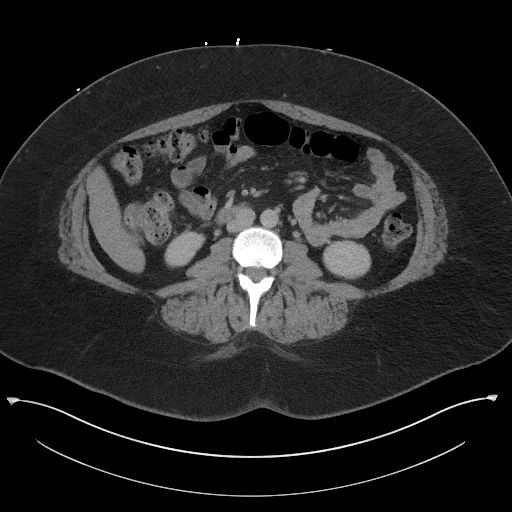
[im 62/111  soft-tissue]
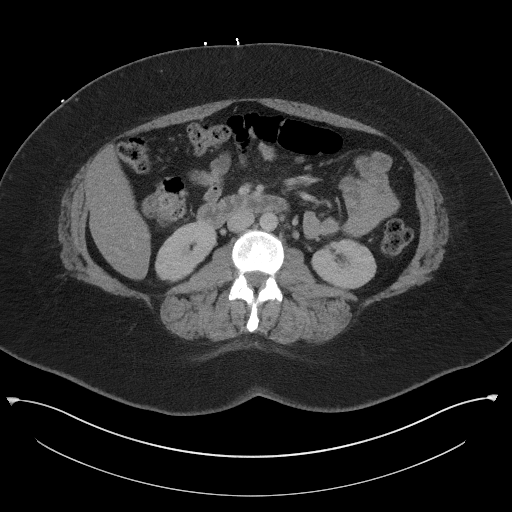
[im 71/111  soft-tissue]
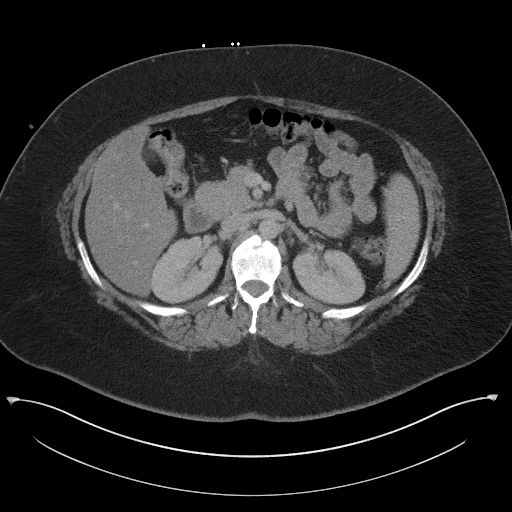
[im 71/111  bone]
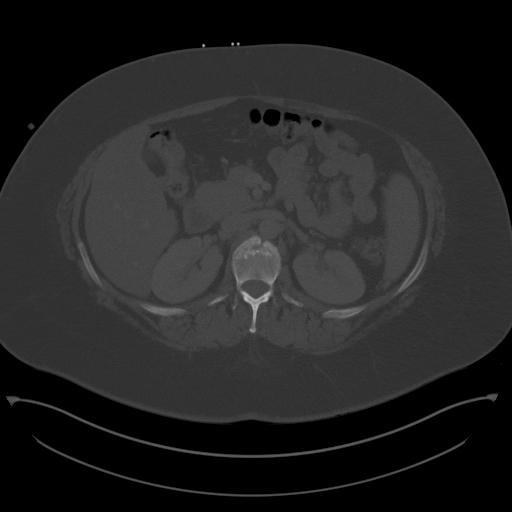
[im 80/111  soft-tissue]
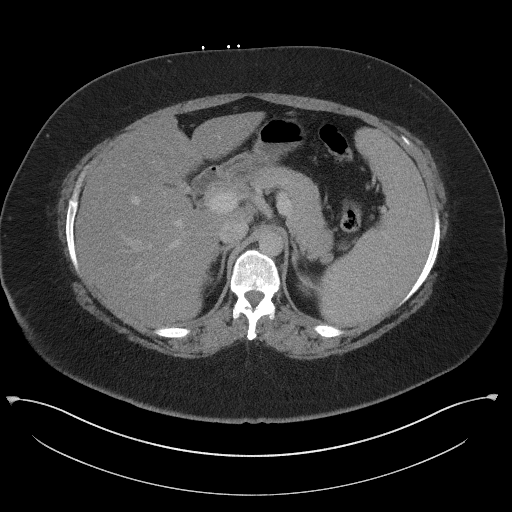
[im 89/111  soft-tissue]
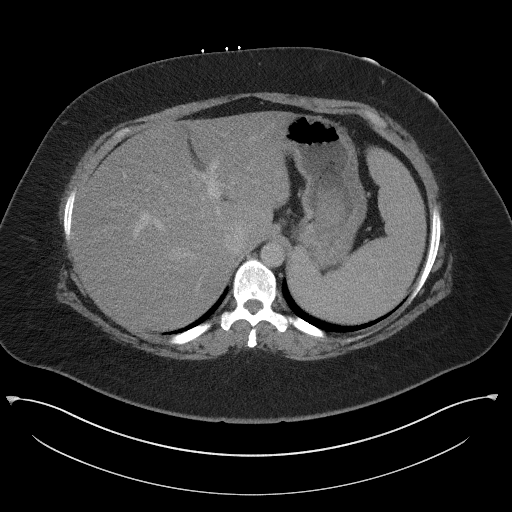
[im 97/111  soft-tissue]
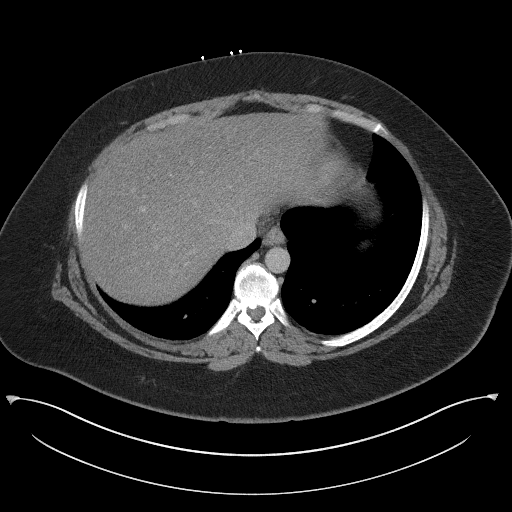
[im 106/111  soft-tissue]
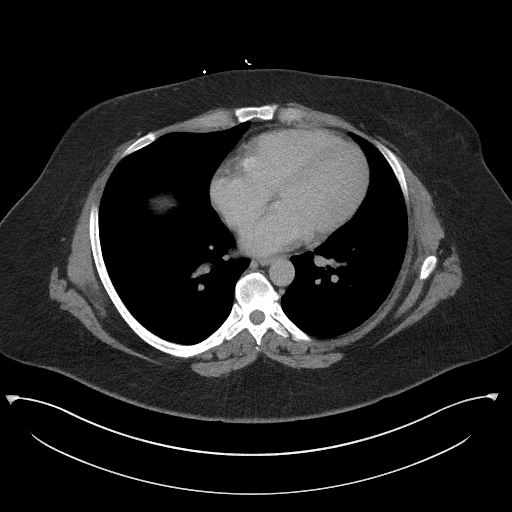

[Series 5: coronal st · coronal · 0.98mm/px · 3 of 114 slices shown]
[im 38/114  soft-tissue]
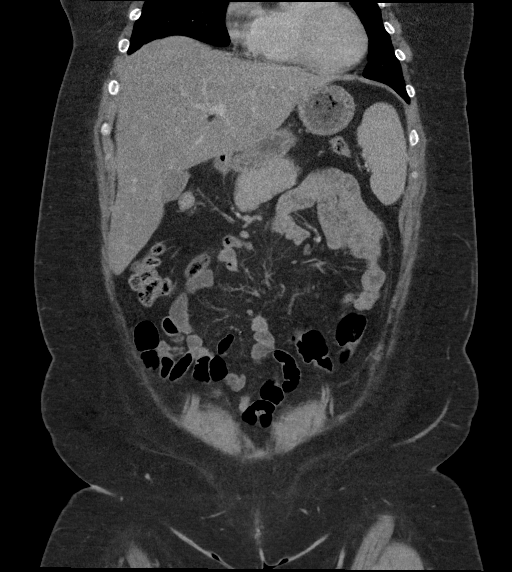
[im 51/114  soft-tissue]
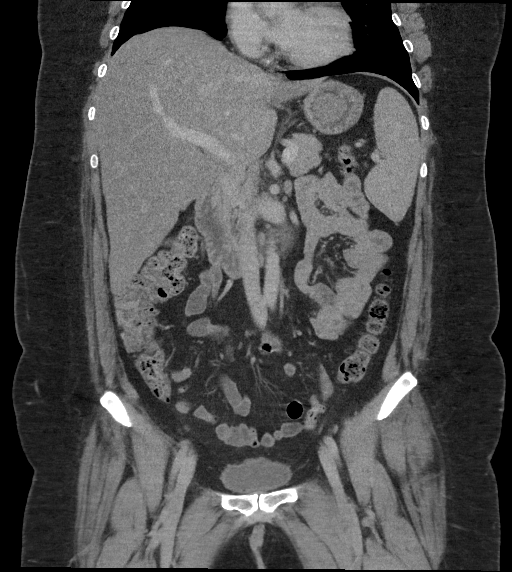
[im 63/114  soft-tissue]
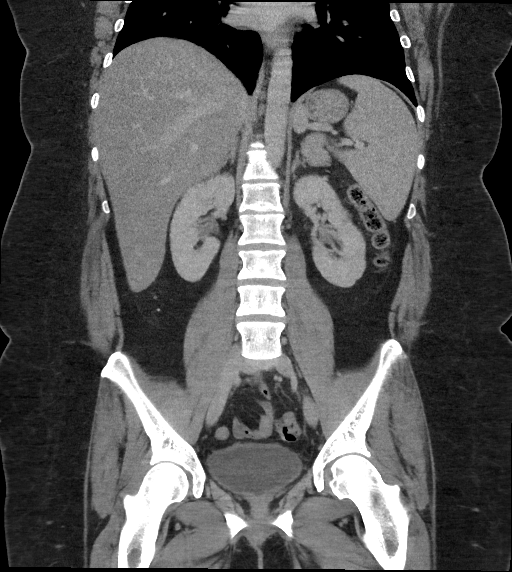

[16 of 46 positions shown; findings below may reference images not displayed]

FINDINGS: Lower chest: The visualized lung bases are clear. The visualized
heart and pericardium are unremarkable.

Hepatobiliary: Moderate hepatic steatosis. Mild hepatomegaly. No
focal enhancing liver mass identified. No intra or extrahepatic
biliary ductal dilation. Gallbladder unremarkable.

Pancreas: Unremarkable

Spleen: Moderate splenomegaly is present with the spleen measuring
17.3 cm in greatest dimension. This appears slightly progressive
since prior examination. No intrasplenic lesion seen.

Adrenals/Urinary Tract: Adrenal glands are unremarkable. The kidneys
are normal in size and position. At least 2 right-sided and 3
left-sided nonobstructing renal calculi are identified measuring up
to 4 mm. No hydronephrosis. No ureteral calculi. Bladder
unremarkable.

Stomach/Bowel: The stomach, small bowel, and large bowel are
unremarkable. Appendix normal. No free intraperitoneal gas or fluid.

Vascular/Lymphatic: There is shotty adenopathy within the ileocolic
lymph node group which appears stable since prior examination and
likely represents the residua of remote inflammation. There is no
pathologic adenopathy within the abdomen and pelvis. The abdominal
vasculature is unremarkable.

Reproductive: Status post hysterectomy. No adnexal masses.

Other: Rectum unremarkable.

Musculoskeletal: No lytic or blastic bone lesions are identified.
IMPRESSION: Mild bilateral nonobstructing nephrolithiasis. No urolithiasis. No
hydronephrosis.

Moderate hepatosplenomegaly. Progressive splenomegaly when compared
to prior examination. Superimposed moderate hepatic steatosis.

## 2023-02-05 ENCOUNTER — Emergency Department
Admission: EM | Admit: 2023-02-05 | Discharge: 2023-02-05 | Disposition: A | Discharge status: Home / Self Care | Payer: BC Managed Care – PPO | Attending: Emergency Medicine | Admitting: Emergency Medicine

## 2023-02-05 ENCOUNTER — Other Ambulatory Visit: Payer: Self-pay

## 2023-02-05 DIAGNOSIS — L509 Urticaria, unspecified: Secondary | ICD-10-CM | POA: Diagnosis not present

## 2023-02-05 DIAGNOSIS — R21 Rash and other nonspecific skin eruption: Secondary | ICD-10-CM | POA: Diagnosis present

## 2023-02-05 MED ORDER — DIPHENHYDRAMINE HCL 12.5 MG/5ML PO ELIX: 25.0000 mg | ORAL_SOLUTION | Freq: Once | ORAL | Status: DC

## 2023-02-05 MED ORDER — SODIUM CHLORIDE 0.9 % IV BOLUS (SEPSIS)
1000.0000 mL | Freq: Once | INTRAVENOUS | Status: AC
  Administered 2023-02-05: 1000 mL via INTRAVENOUS

## 2023-02-05 MED ORDER — EPINEPHRINE 0.3 MG/0.3ML IJ SOAJ: 0.3000 mg | Freq: Once | INTRAMUSCULAR | 0 refills | Status: AC

## 2023-02-05 MED ORDER — PREDNISONE 50 MG PO TABS: 50.0000 mg | ORAL_TABLET | Freq: Every day | ORAL | 0 refills | Status: DC

## 2023-02-05 MED ORDER — FAMOTIDINE IN NACL 20-0.9 MG/50ML-% IV SOLN
20.0000 mg | Freq: Once | INTRAVENOUS | Status: AC
  Administered 2023-02-05: 20 mg via INTRAVENOUS
  Filled 2023-02-05: qty 50

## 2023-02-05 MED ORDER — PREDNISONE 50 MG PO TABS: 50.0000 mg | ORAL_TABLET | Freq: Every day | ORAL | 0 refills | Status: AC

## 2023-02-05 MED ORDER — DIPHENHYDRAMINE HCL 50 MG/ML IJ SOLN
25.0000 mg | Freq: Once | INTRAMUSCULAR | Status: AC
  Administered 2023-02-05: 25 mg via INTRAVENOUS
  Filled 2023-02-05: qty 1

## 2023-02-05 MED ORDER — ONDANSETRON HCL 4 MG/2ML IJ SOLN
4.0000 mg | Freq: Once | INTRAMUSCULAR | Status: AC
  Administered 2023-02-05: 4 mg via INTRAVENOUS
  Filled 2023-02-05: qty 2

## 2023-02-05 MED ORDER — SODIUM CHLORIDE 0.9 % IV SOLN: 1000.0000 mL | INTRAVENOUS | Status: DC

## 2023-02-05 NOTE — ED Triage Notes (Signed)
Arrived by EMS from [facility] with c/o hives that started yesterday evening. NKDA.   History POTS  C/o Nausea, dizziness, and palpitations  UC gave benadryl and decadron  EMS vitals: Lung sounds clear 136/108b/p 90HR 99% RA 113CBG

## 2023-02-05 NOTE — ED Provider Notes (Signed)
[facility] Emergency Department Provider Note     Event Date/Time   First MD Initiated Contact with Patient 02/05/23 1805     (approximate)   History   Rash   HPI  Debra Terry is a 37 y.o. female with a medical history of POTS who presents to the ED with complaint of a rash noticed last night.  The rash is red and diffuse over entire body.  Pruritic in nature.  No known trigger.  Patient denies new medication, foods, or new environmental contacts.  She has never experienced this before.  Patient went to urgent care today and was given Benadryl and steroids with some improvement. She denies fever, chest pain, shortness of breath and difficulty swallowing.       Physical Exam   Triage Vital Signs: ED Triage Vitals  Enc Vitals Group     BP 02/05/23 1720 128/87     Pulse Rate 02/05/23 1720 (!) 104     Resp 02/05/23 1720 18     Temp 02/05/23 1720 98.6 F (37 C)     Temp Source 02/05/23 1720 Oral     SpO2 02/05/23 1720 100 %     Weight 02/05/23 1721 (!) 309 lb (140.2 kg)     Height 02/05/23 1721 5\' 11"  (1.803 m)     Head Circumference --      Peak Flow --      Pain Score 02/05/23 1721 0     Pain Loc --      Pain Edu? --      Excl. in GC? --     Most recent vital signs: Vitals:   02/05/23 1943 02/05/23 2030  BP:  125/73  Pulse: 91 83  Resp: 17 20  Temp:    SpO2: 95% 96%    General Awake, no distress.  HEENT NCAT. PERRL. EOMI. No rhinorrhea. Mucous membranes are moist. Airway patent. No oropharyngeal edema noted. CV:  Good peripheral perfusion. RRR RESP:  Normal effort. LCTAB ABD:  No distention.  Other:  Diffuse, scattered, blanchable erythremic raised wheals over LE, UE, trunk, face and neck.      ED Results / Procedures / Treatments   Labs (all labs ordered are listed, but only abnormal results are displayed) Labs Reviewed - No data to display   PROCEDURES:  Critical Care performed: No  Procedures   MEDICATIONS  ORDERED IN ED: Medications  famotidine (PEPCID) IVPB 20 mg premix (0 mg Intravenous Stopped 02/05/23 1937)  diphenhydrAMINE (BENADRYL) injection 25 mg (25 mg Intravenous Given 02/05/23 1858)  sodium chloride 0.9 % bolus 1,000 mL (0 mLs Intravenous Stopped 02/05/23 1937)  ondansetron (ZOFRAN) injection 4 mg (4 mg Intravenous Given 02/05/23 1937)     IMPRESSION / MDM / ASSESSMENT AND PLAN / ED COURSE  I reviewed the triage vital signs and the nursing notes.                             Differential diagnosis includes, but is not limited to allergic reaction, anaphylaxis, angioedema, contact dermatitis. Patient's presentation is most consistent with acute complicated illness / injury requiring diagnostic workup.  37 y.o. female presents to the emergency department for evaluation and treatment of acute pruritic rash with no association with airway obstruction.  Vital signs are stable. See HPI for further details. The patient is on the cardiac monitor to evaluate for evidence of arrhythmia and/or significant heart rate changes.  Patient is given fluids, Benadryl, and famotidine due to the rash quickly spreading  diffusely. Reassessment patient alert.  Has improved.  Erythemic rash is progressed to a pink warm.  Patient is a bit drowsy.  But this is symptoms have improved a bit.  Not completely resolved at this time.  She reports she is having some nausea.  I gave her Zofran.  We discussed further observation in the ED prior to discharge given stable vitals.  Patient agreed.  Please see ED clinical course. Patient will be discharged home with prescriptions for short dose of prednisone and a EpiPen for future use only if patient is experiencing similar symptoms with the addition of shortness of breath or throat swelling.  We also discussed taking Benadryl every 6 hours up to 50 mg if needed for continuing itchiness.  Patient is to follow up with primary care as needed or otherwise directed. Patient is given ED  precautions to return to the ED for any worsening or new symptoms. Patient verbalizes understanding and agrees with assessment and plan.   Clinical Course as of 02/06/23 0455  Thu Feb 05, 2023  2021 Reassessment patient rash has improved.  Vitals stable.  Patient is in stable condition for discharge. [MH]    Clinical Course User Index [MH] Romeo Apple, Kasidee Voisin A, PA-C    FINAL CLINICAL IMPRESSION(S) / ED DIAGNOSES   Final diagnoses:  Urticaria     Rx / DC Orders   ED Discharge Orders          Ordered    EPINEPHrine 0.3 mg/0.3 mL IJ SOAJ injection   Once        02/05/23 2018    predniSONE (DELTASONE) 50 MG tablet  Daily with breakfast,   Status:  Discontinued        02/05/23 2018    predniSONE (DELTASONE) 50 MG tablet  Daily with breakfast        02/05/23 2039             Note:  This document was prepared using Dragon voice recognition software and may include unintentional dictation errors.    Romeo Apple, Mikko Lewellen A, PA-C 02/06/23 7829    Minna Antis, MD 02/06/23 [phone]

## 2023-02-05 NOTE — ED Triage Notes (Signed)
Pt hives all over body that started last night. Pt states she thinks it is due to stress. Denies any changes in medication, lotions detergents, no new foods, has not been outside. Went to urgent care and started to get nauseated and weak. Was then given benadryl and decadron and transport to ER by EMS. Pt denies SOB

## 2023-02-05 NOTE — ED Notes (Signed)
Pt verbalizes understanding of discharge instructions. Opportunity for questioning and answers were provided. Pt discharged from ED to home with significant other.   ? ?

## 2023-02-05 NOTE — Discharge Instructions (Addendum)
EpiPen sent to pharmacy only for future use of shortness of breath or throat swelling.  DO NOT use today.   Benadryl can be taken for itching once every 6 hours up to 50 mg.

## 2023-10-02 ENCOUNTER — Other Ambulatory Visit: Payer: Self-pay

## 2023-10-02 DIAGNOSIS — R1032 Left lower quadrant pain: Secondary | ICD-10-CM | POA: Diagnosis present

## 2023-10-02 LAB — CBC
HCT: 42.3 % (ref 36.0–46.0)
Hemoglobin: 14.3 g/dL (ref 12.0–15.0)
MCH: 31.4 pg (ref 26.0–34.0)
MCHC: 33.8 g/dL (ref 30.0–36.0)
MCV: 93 fL (ref 80.0–100.0)
Platelets: 216 10*3/uL (ref 150–400)
RBC: 4.55 MIL/uL (ref 3.87–5.11)
RDW: 12.5 % (ref 11.5–15.5)
WBC: 8.8 10*3/uL (ref 4.0–10.5)
nRBC: 0 % (ref 0.0–0.2)

## 2023-10-02 LAB — COMPREHENSIVE METABOLIC PANEL
ALT: 18 U/L (ref 0–44)
AST: 21 U/L (ref 15–41)
Albumin: 4.2 g/dL (ref 3.5–5.0)
Alkaline Phosphatase: 78 U/L (ref 38–126)
Anion gap: 10 (ref 5–15)
BUN: 16 mg/dL (ref 6–20)
CO2: 23 mmol/L (ref 22–32)
Calcium: 8.7 mg/dL — ABNORMAL LOW (ref 8.9–10.3)
Chloride: 105 mmol/L (ref 98–111)
Creatinine, Ser: 0.57 mg/dL (ref 0.44–1.00)
GFR, Estimated: >60 mL/min (ref >60)
Glucose, Bld: 99 mg/dL (ref 70–99)
Potassium: 4.1 mmol/L (ref 3.5–5.1)
Sodium: 138 mmol/L (ref 135–145)
Total Bilirubin: 1.1 mg/dL (ref 0.0–1.2)
Total Protein: 7.2 g/dL (ref 6.5–8.1)

## 2023-10-02 LAB — LIPASE, BLOOD: Lipase: 55 U/L — ABNORMAL HIGH (ref 11–51)

## 2023-10-02 NOTE — ED Triage Notes (Signed)
Pt to ed from home via POV via tele health doc. Pt has pain under her left rib that radiates into her back x 2 week. Pt denies lifting anything heavy out of the normal.   Pt has clay color feces that started yesterday. Pt denies diarrhea.   Pt is caox4, in no acute distress and ambulatory in triage.

## 2023-10-03 ENCOUNTER — Emergency Department: Payer: 59

## 2023-10-03 ENCOUNTER — Emergency Department
Admission: EM | Admit: 2023-10-03 | Discharge: 2023-10-03 | Disposition: A | Discharge status: Home / Self Care | Payer: Managed Care, Other (non HMO) | Attending: Emergency Medicine | Admitting: Emergency Medicine

## 2023-10-03 DIAGNOSIS — R109 Unspecified abdominal pain: Secondary | ICD-10-CM

## 2023-10-03 LAB — URINALYSIS, W/ REFLEX TO CULTURE (INFECTION SUSPECTED)
Bacteria, UA: NONE SEEN
Bilirubin Urine: NEGATIVE
Glucose, UA: NEGATIVE mg/dL
Hgb urine dipstick: NEGATIVE
Ketones, ur: NEGATIVE mg/dL
Leukocytes,Ua: NEGATIVE
Nitrite: NEGATIVE
Protein, ur: NEGATIVE mg/dL
Specific Gravity, Urine: 1.012 (ref 1.005–1.030)
pH: 5 (ref 5.0–8.0)

## 2023-10-03 LAB — HCG, QUANTITATIVE, PREGNANCY: hCG, Beta Chain, Quant, S: 1 m[IU]/mL (ref <5)

## 2023-10-03 LAB — POC URINE PREG, ED: Preg Test, Ur: NEGATIVE

## 2023-10-03 MED ORDER — CYCLOBENZAPRINE HCL 10 MG PO TABS: 10.0000 mg | ORAL_TABLET | Freq: Three times a day (TID) | ORAL | 0 refills | Status: AC | PRN

## 2023-10-03 MED ORDER — KETOROLAC TROMETHAMINE 15 MG/ML IJ SOLN
15.0000 mg | Freq: Once | INTRAMUSCULAR | Status: AC
  Administered 2023-10-03: 15 mg via INTRAMUSCULAR
  Filled 2023-10-03: qty 1

## 2023-10-03 MED ORDER — HYDROCODONE-ACETAMINOPHEN 5-325 MG PO TABS
1.0000 | ORAL_TABLET | Freq: Once | ORAL | Status: AC
  Administered 2023-10-03: 1 via ORAL
  Filled 2023-10-03: qty 1

## 2023-10-03 MED ORDER — NAPROXEN 500 MG PO TABS: 500.0000 mg | ORAL_TABLET | Freq: Two times a day (BID) | ORAL | 0 refills | Status: AC

## 2023-10-03 NOTE — Discharge Instructions (Signed)
You are seen in the ER today for your flank pain.  Your testing fortunately did not show an emergency cause for this.  I suspect that you may have a muscle strain or some ongoing inflammation from your recent flu infection.  You can take Tylenol to help with your pain.  I also sent a prescription for an anti-inflammatory.  Do not take other NSAIDs such as ibuprofen or Aleve with this.  I have also sent in a prescription for a muscle relaxer that you can take as needed.  This can make you drowsy, do not drive or operate machinery when taking this.  Follow with your primary care doctor for further evaluation.  Return to the ER for new or worsening symptoms.

## 2023-10-03 NOTE — ED Provider Notes (Signed)
[facility] Provider Note    Event Date/Time   First MD Initiated Contact with Patient 10/03/23 [phone]     (approximate)   History   Shoulder Pain (Left)   HPI  Debra Terry is a 38 year old female presenting to the emergency department for evaluation of left flank pain.  Pain began a few weeks ago.  No heavy lifting or trauma.  Pain occasionally radiates up into her shoulder.  History of kidney infection as a child, unsure if this felt similar.  No fevers or chills.  Does report some occasional shortness of breath.  No nausea or vomiting.    Physical Exam   Triage Vital Signs: ED Triage Vitals  Encounter Vitals Group     BP 10/02/23 1859 127/75     Systolic BP Percentile --      Diastolic BP Percentile --      Pulse Rate 10/02/23 1859 69     Resp 10/02/23 1859 16     Temp 10/02/23 1859 98.7 F (37.1 C)     Temp Source 10/02/23 1859 Oral     SpO2 10/02/23 1859 98 %     Weight 10/02/23 1856 (!) 313 lb 0.9 oz (142 kg)     Height 10/02/23 1856 5\' 11"  (1.803 m)     Head Circumference --      Peak Flow --      Pain Score 10/02/23 1856 6     Pain Loc --      Pain Education --      Exclude from Growth Chart --     Most recent vital signs: Vitals:   10/02/23 1859 10/03/23 0054  BP: 127/75 100/64  Pulse: 69 63  Resp: 16 17  Temp: 98.7 F (37.1 C) 98 F (36.7 C)  SpO2: 98% 100%     General: Awake, interactive  CV:  Regular rate, good peripheral perfusion.  Resp:  Unlabored respirations, lungs clear to auscultation Abd:  Nondistended, soft, mild tenderness over the left upper quadrant, remainder of abdomen nontender.  Does have tenderness over her left flank Neuro:  Symmetric facial movement, fluid speech   ED Results / Procedures / Treatments   Labs (all labs ordered are listed, but only abnormal results are displayed) Labs Reviewed  LIPASE, BLOOD - Abnormal; Notable for the following components:      Result Value   Lipase 55 (*)     All other components within normal limits  COMPREHENSIVE METABOLIC PANEL - Abnormal; Notable for the following components:   Calcium 8.7 (*)    All other components within normal limits  URINALYSIS, W/ REFLEX TO CULTURE (INFECTION SUSPECTED) - Abnormal; Notable for the following components:   Color, Urine YELLOW (*)    APPearance CLEAR (*)    All other components within normal limits  POC URINE PREG, ED - Normal  CBC  HCG, QUANTITATIVE, PREGNANCY     EKG EKG independently reviewed interpreted by myself (ER attending) demonstrates:  EKG demonstrates sinus rhythm at a rate of 58, PR 156, QRS 102, QTc 450, no acute ST changes  RADIOLOGY Imaging independently reviewed and interpreted by myself demonstrates:  CXR without focal consolidation CT renal stone without acute abnormality  PROCEDURES:  Critical Care performed: No  Procedures   MEDICATIONS ORDERED IN ED: Medications  HYDROcodone-acetaminophen (NORCO/VICODIN) 5-325 MG per tablet 1 tablet (1 tablet Oral Given 10/03/23 0201)  ketorolac (TORADOL) 15 MG/ML injection 15 mg (15 mg Intramuscular Given 10/03/23 0203)  IMPRESSION / MDM / ASSESSMENT AND PLAN / ED COURSE  I reviewed the triage vital signs and the nursing notes.  Differential diagnosis includes, but is not limited to, pneumonia, pneumothorax, low risk PE and PERC negative, renal stone, pyelonephritis, lower suspicion other acute intra-abdominal process  Patient's presentation is most consistent with acute presentation with potential threat to life or bodily function.  38 year old female presenting with left flank pain.  Vital stable on presentation.  Labs from triage with minimally elevated lipase, not consistent with pancreatitis.  Does have primarily left flank pain on exam.  Will obtain chest x-Aneudy Champlain and CT abdomen pelvis to further evaluate.  Treated symptomatically with Norco and Toradol.  X-Melonie Germani and CT reassuring.  Urine without evidence of infection.  On  reevaluation, patient does report improvement in her pain.  Recent influenza infection, discussed that pain could be related to musculoskeletal strain or possible inflammatory process such as pleurisy.  Patient is comfortable with discharge home.  Will DC with anti-inflammatory and as needed muscle relaxer.  Strict return precautions provided.  Patient discharged in stable condition.      FINAL CLINICAL IMPRESSION(S) / ED DIAGNOSES   Final diagnoses:  Left flank pain     Rx / DC Orders   ED Discharge Orders          Ordered    cyclobenzaprine (FLEXERIL) 10 MG tablet  3 times daily PRN        10/03/23 0339    naproxen (NAPROSYN) 500 MG tablet  2 times daily with meals        10/03/23 1610             Note:  This document was prepared using Dragon voice recognition software and may include unintentional dictation errors.   Trinna Post, MD 10/03/23 [phone]
